# Patient Record
Sex: Male | Born: 1945
Health system: Southern US, Community
[De-identification: ages and names within clinical notes are randomized; demographics above are authoritative.]

## PROBLEM LIST (undated history)

## (undated) DIAGNOSIS — C801 Malignant (primary) neoplasm, unspecified: Secondary | ICD-10-CM

## (undated) DIAGNOSIS — K4031 Unilateral inguinal hernia, with obstruction, without gangrene, recurrent: Secondary | ICD-10-CM

## (undated) DIAGNOSIS — K420 Umbilical hernia with obstruction, without gangrene: Secondary | ICD-10-CM

## (undated) DIAGNOSIS — M1990 Unspecified osteoarthritis, unspecified site: Secondary | ICD-10-CM

## (undated) HISTORY — PX: PROSTATE BIOPSY: SHX241

## (undated) HISTORY — DX: Unspecified osteoarthritis, unspecified site: M19.90

## (undated) HISTORY — DX: Unilateral inguinal hernia, with obstruction, without gangrene, recurrent: K40.31

## (undated) HISTORY — PX: OTHER SURGICAL HISTORY: SHX169

## (undated) HISTORY — DX: Malignant (primary) neoplasm, unspecified: C80.1

## (undated) HISTORY — DX: Umbilical hernia with obstruction, without gangrene: K42.0

---

## 2000-03-08 HISTORY — PX: HERNIA REPAIR: SHX51

## 2004-03-08 HISTORY — PX: HERNIA REPAIR: SHX51

## 2007-03-20 ENCOUNTER — Ambulatory Visit: Payer: Self-pay | Admitting: Gastroenterology

## 2007-12-23 LAB — HM COLONOSCOPY: HM Colonoscopy: NORMAL

## 2011-03-09 DIAGNOSIS — M1990 Unspecified osteoarthritis, unspecified site: Secondary | ICD-10-CM

## 2011-03-09 HISTORY — DX: Unspecified osteoarthritis, unspecified site: M19.90

## 2012-02-01 ENCOUNTER — Encounter: Payer: Self-pay | Admitting: Internal Medicine

## 2012-02-25 ENCOUNTER — Encounter: Payer: Self-pay | Admitting: Internal Medicine

## 2012-02-25 ENCOUNTER — Ambulatory Visit (INDEPENDENT_AMBULATORY_CARE_PROVIDER_SITE_OTHER): Payer: BC Managed Care – PPO | Admitting: Internal Medicine

## 2012-02-25 VITALS — BP 100/60 | HR 51 | Temp 98.2°F | Resp 12 | Ht 67.0 in | Wt 155.8 lb

## 2012-02-25 DIAGNOSIS — E785 Hyperlipidemia, unspecified: Secondary | ICD-10-CM

## 2012-02-25 DIAGNOSIS — Z0000 Encounter for general adult medical examination without abnormal findings: Secondary | ICD-10-CM

## 2012-02-25 DIAGNOSIS — M1710 Unilateral primary osteoarthritis, unspecified knee: Secondary | ICD-10-CM

## 2012-02-25 DIAGNOSIS — M25569 Pain in unspecified knee: Secondary | ICD-10-CM

## 2012-02-25 DIAGNOSIS — R5383 Other fatigue: Secondary | ICD-10-CM

## 2012-02-25 DIAGNOSIS — K4030 Unilateral inguinal hernia, with obstruction, without gangrene, not specified as recurrent: Secondary | ICD-10-CM

## 2012-02-25 DIAGNOSIS — E559 Vitamin D deficiency, unspecified: Secondary | ICD-10-CM

## 2012-02-25 DIAGNOSIS — Z23 Encounter for immunization: Secondary | ICD-10-CM

## 2012-02-25 LAB — COMPREHENSIVE METABOLIC PANEL
ALT: 21 U/L (ref 0–53)
Albumin: 3.7 g/dL (ref 3.5–5.2)
CO2: 30 mEq/L (ref 19–32)
Calcium: 8.8 mg/dL (ref 8.4–10.5)
Chloride: 103 mEq/L (ref 96–112)
GFR: 86.38 mL/min (ref >60.00)
Glucose, Bld: 82 mg/dL (ref 70–99)
Sodium: 138 mEq/L (ref 135–145)
Total Bilirubin: 0.9 mg/dL (ref 0.3–1.2)
Total Protein: 7 g/dL (ref 6.0–8.3)

## 2012-02-25 LAB — CBC WITH DIFFERENTIAL/PLATELET
Basophils Absolute: 0 10*3/uL (ref 0.0–0.1)
Eosinophils Relative: 2.2 % (ref 0.0–5.0)
HCT: 43.5 % (ref 39.0–52.0)
Hemoglobin: 14.6 g/dL (ref 13.0–17.0)
Lymphocytes Relative: 28.9 % (ref 12.0–46.0)
Lymphs Abs: 1.7 10*3/uL (ref 0.7–4.0)
Monocytes Relative: 8.7 % (ref 3.0–12.0)
Platelets: 195 10*3/uL (ref 150.0–400.0)
RDW: 13.5 % (ref 11.5–14.6)
WBC: 5.9 10*3/uL (ref 4.5–10.5)

## 2012-02-25 LAB — LIPID PANEL
LDL Cholesterol: 113 mg/dL — ABNORMAL HIGH (ref 0–99)
Total CHOL/HDL Ratio: 5

## 2012-02-25 LAB — TSH: TSH: 0.42 u[IU]/mL (ref 0.35–5.50)

## 2012-02-25 NOTE — Patient Instructions (Addendum)
You received the pneumonia vaccine today  You do need a TDaP vaccine (tetanus diptheria and petussis); ask your daughter if she can get it for you and save you $$$$  Try glucosamine chondroitin sulfate supplements for your knees (osteo Biflex)   We will obtain plain films of your knees to evaluate the joint spaces .  Please get them done at the [facility] [place] office Monday through Friday fro m8 to 5  .  You do not need an appt.   I will e mail your your results if you sign up for MyChart

## 2012-02-25 NOTE — Progress Notes (Signed)
Patient ID: Ethan Singleton, male   DOB: [birth year], 66 y.o.   MRN: [id number] Patient Active Problem List  Diagnosis  . Inguinal hernia with irreducibility  . DJD (degenerative joint disease) of knee  . Routine general medical examination at a health care facility    Subjective:  CC:   Chief Complaint  Patient presents with  . Annual Exam    HPI:   Ethan Singleton is a 66 y.o. male who presents as a new patient to establish primary care with the chief complaint of  Need for annual exam he is requesting  a physical>  Hr has a   history of prosate CA diagnosed 2 years ago .  Treatment has been watchful waiting and finasteride for BPH.  His last biopsy in June was normal .  Feel that his right inguinal hernia has returned after a heavy lifting incident of 50 lbs at work.  He has a history of hernia repair with mesh on the right.  2) Bilateral knees are bothering him,  Ran  Long distance for years.  Aching pain.,  Very stiff in the morning.,  history of partial meniscotomy left knee 2003 after tennis .      Past Medical History  Diagnosis Date  . prostate cancer     Past Surgical History  Procedure Date  . Hernia repair 2006    done in [place], right   . Meniscotomy     partial, right knee    Family History  Problem Relation Age of Onset  . Hypertension Mother   . Heart disease Father   . Cancer Sister     lung Ca,  passive smoke exposure     History   Social History  . Marital Status: Unknown    Spouse Name: N/A    Number of Children: N/A  . Years of Education: N/A   Occupational History  . Not on file.   Social History Main Topics  . Smoking status: Former Games developer  . Smokeless tobacco: Not on file  . Alcohol Use: No  . Drug Use: No  . Sexually Active: Not on file   Other Topics Concern  . Not on file   Social History Narrative  . No narrative on file    No Known Allergies   Review of Systems:   The remainder of the review of systems was negative except  those addressed in the HPI.    Objective:  BP 100/60  Pulse 51  Temp 98.2 F (36.8 C) (Oral)  Resp 12  Ht 5\' 7"  (1.702 m)  Wt 155 lb 12 oz (70.648 kg)  BMI 24.39 kg/m2  SpO2 95%  General appearance: alert, cooperative and appears stated age Ears: normal TM's and external ear canals both ears Throat: lips, mucosa, and tongue normal; teeth and gums normal Neck: no adenopathy, no carotid bruit, supple, symmetrical, trachea midline and thyroid not enlarged, symmetric, no tenderness/mass/nodules Back: symmetric, no curvature. ROM normal. No CVA tenderness. Lungs: clear to auscultation bilaterally Heart: regular rate and rhythm, S1, S2 normal, no murmur, click, rub or gallop Abdomen: soft, non-tender; bowel sounds normal; no masses,  no organomegaly Pulses: 2+ and symmetric Skin: Skin color, texture, turgor normal. No rashes or lesions Lymph nodes: Cervical, supraclavicular, and axillary nodes normal. URO:  Right inguinal swelling/hernia, nonreducible   Assessment and Plan:  Inguinal hernia with irreducibility Recurrent, on the right, with prior surgical repair with mesh.  Referral to [facility].   DJD (degenerative joint  disease) of knee Recommended trial of NSAIDs and tylenol.   Routine general medical examination at a health care facility Annual exam done excluding  prostate exam since she is followed by Urology.    Updated Medication List Outpatient Encounter Prescriptions as of 02/25/2012  Medication Sig Dispense Refill  . pseudoephedrine (SUDAFED) 30 MG tablet Take 30 mg by mouth every 4 (four) hours as needed.         Orders Placed This Encounter  Procedures  . DG Knee Complete 4 Views Left  . DG Knee Complete 4 Views Right  . Pneumococcal polysaccharide vaccine 23-valent greater than or equal to 2yo subcutaneous/IM  . Lipid panel  . CBC with Differential  . Comprehensive metabolic panel  . TSH  . Vitamin D 25 hydroxy  . Ambulatory referral to General  Surgery  . HM COLONOSCOPY    No Follow-up on file.

## 2012-02-26 LAB — VITAMIN D 25 HYDROXY (VIT D DEFICIENCY, FRACTURES): Vit D, 25-Hydroxy: 29 ng/mL — ABNORMAL LOW (ref 30–89)

## 2012-02-28 ENCOUNTER — Encounter: Payer: Self-pay | Admitting: Internal Medicine

## 2012-02-28 DIAGNOSIS — M1710 Unilateral primary osteoarthritis, unspecified knee: Secondary | ICD-10-CM | POA: Insufficient documentation

## 2012-02-28 DIAGNOSIS — Z0000 Encounter for general adult medical examination without abnormal findings: Secondary | ICD-10-CM | POA: Insufficient documentation

## 2012-02-28 DIAGNOSIS — K4030 Unilateral inguinal hernia, with obstruction, without gangrene, not specified as recurrent: Secondary | ICD-10-CM | POA: Insufficient documentation

## 2012-02-28 NOTE — Assessment & Plan Note (Signed)
Recurrent, on the right, with prior surgical repair with mesh.  Referral to [facility].

## 2012-02-28 NOTE — Assessment & Plan Note (Signed)
Annual exam done excluding  prostate exam since she is followed by Urology.

## 2012-02-28 NOTE — Assessment & Plan Note (Signed)
Recommended trial of NSAIDs and tylenol.

## 2012-03-02 ENCOUNTER — Encounter: Payer: Self-pay | Admitting: Internal Medicine

## 2012-03-02 NOTE — Telephone Encounter (Signed)
Ethan Singleton, I am not sure why you can't review your labs when you click on the icon. I have copied and pasted them to this email.  Along with the message Dr. Darrick Huntsman placed on the labs.  Thank you [name]    Cholesterol 168   Comments:    ATP III Classification       Desirable:  < 200 mg/dL               Borderline High:  200 - 239 mg/dL          High:  > = 478 mg/dL    Triglycerides 295.6    Comments:    Normal:  <150 mg/dLBorderline High:  150 - 199 mg/dL    HDL 21.30 (L)     VLDL 21.8   LDL Cholesterol 113 (H)  0 - 99 mg/dL    Total CHOL/HDL Ratio 5     Comments:                   Men          Women1/2 Average Risk     3.4          3.3Average Risk          5.0          4.42X Average Risk          9.6          7.13X Average Risk          15.0          11.0        Value Range    WBC 5.9    RBC 4.81      Hemoglobin 14.6     HCT 43.5      MCV 90.5     MCHC 33.4     RDW 13.5      Platelets 195.0    Neutrophils Relative 59.5      Lymphocytes Relative 28.9     Monocytes Relative 8.7     Eosinophils Relative 2.2     Basophils Relative 0.7    Neutro Abs 3.5      Lymphs Abs 1.7    Monocytes Absolute 0.5     Eosinophils Absolute 0.1     Basophils Absolute 0.0      Sodium 138     Potassium 3.6    Chloride 103   CO2 30   Glucose, Bld 82     BUN 12    Creatinine, Ser 0.9    Total Bilirubin 0.9    Alkaline Phosphatase 50   AST 23   ALT 21   Total Protein 7.0    Albumin 3.7    Calcium 8.8     GFR 86.38     TSH 0.42 0.35     Vit D, 25-Hydroxy 29 (L)  Comments:          Ethan Singleton,   Your labs are all normal except for a low Vitamin D .  I suggest you start taking 2000 units of Vitamin D 3 daily  For the winter months.  Once spring is here you can reduce your dose to 1000 units daily .   Dr. Darrick Huntsman

## 2012-03-03 ENCOUNTER — Ambulatory Visit (INDEPENDENT_AMBULATORY_CARE_PROVIDER_SITE_OTHER)
Admission: RE | Admit: 2012-03-03 | Discharge: 2012-03-03 | Disposition: A | Discharge status: Home / Self Care | Payer: BC Managed Care – PPO | Source: Ambulatory Visit | Attending: Internal Medicine | Admitting: Internal Medicine

## 2012-03-03 DIAGNOSIS — M25569 Pain in unspecified knee: Secondary | ICD-10-CM

## 2012-03-05 ENCOUNTER — Telehealth: Payer: Self-pay | Admitting: Internal Medicine

## 2012-03-06 ENCOUNTER — Ambulatory Visit: Payer: Self-pay | Admitting: Internal Medicine

## 2012-03-08 DIAGNOSIS — K420 Umbilical hernia with obstruction, without gangrene: Secondary | ICD-10-CM

## 2012-03-08 DIAGNOSIS — K4031 Unilateral inguinal hernia, with obstruction, without gangrene, recurrent: Secondary | ICD-10-CM

## 2012-03-08 HISTORY — DX: Unilateral inguinal hernia, with obstruction, without gangrene, recurrent: K40.31

## 2012-03-08 HISTORY — DX: Umbilical hernia with obstruction, without gangrene: K42.0

## 2012-03-12 ENCOUNTER — Encounter: Payer: Self-pay | Admitting: Internal Medicine

## 2012-03-18 ENCOUNTER — Encounter: Payer: Self-pay | Admitting: Internal Medicine

## 2012-03-20 NOTE — Telephone Encounter (Signed)
I do not know what letter pt is referencing. Please advise.

## 2012-04-22 ENCOUNTER — Other Ambulatory Visit: Payer: Self-pay

## 2012-05-08 ENCOUNTER — Ambulatory Visit: Payer: Self-pay | Admitting: General Surgery

## 2012-05-19 ENCOUNTER — Ambulatory Visit: Payer: Self-pay | Admitting: General Surgery

## 2012-05-19 DIAGNOSIS — K4091 Unilateral inguinal hernia, without obstruction or gangrene, recurrent: Secondary | ICD-10-CM

## 2012-05-19 DIAGNOSIS — K420 Umbilical hernia with obstruction, without gangrene: Secondary | ICD-10-CM

## 2012-05-19 HISTORY — PX: UMBILICAL HERNIA REPAIR: SHX196

## 2012-05-31 ENCOUNTER — Encounter: Payer: Self-pay | Admitting: *Deleted

## 2012-06-05 ENCOUNTER — Ambulatory Visit (INDEPENDENT_AMBULATORY_CARE_PROVIDER_SITE_OTHER): Payer: BC Managed Care – PPO | Admitting: General Surgery

## 2012-06-05 ENCOUNTER — Encounter: Payer: Self-pay | Admitting: General Surgery

## 2012-06-05 VITALS — BP 124/76 | HR 76 | Resp 12 | Ht 68.0 in | Wt 160.0 lb

## 2012-06-05 DIAGNOSIS — K429 Umbilical hernia without obstruction or gangrene: Secondary | ICD-10-CM

## 2012-06-05 DIAGNOSIS — K4091 Unilateral inguinal hernia, without obstruction or gangrene, recurrent: Secondary | ICD-10-CM

## 2012-06-05 NOTE — Progress Notes (Signed)
Patient ID: Ethan Singleton, male   DOB: [birth year], 67 y.o.   MRN: [id number]  Chief Complaint  Patient presents with  . Routine Post Op    hernia post op appt    HPI Ethan Singleton is a 67 y.o. male that presents for post op umbilical and recurrent right inguinal hernia repair. No mesh used at umbilicus. Progrip mesh in inguinal areaThe procedure was performed on 05/19/12.  HPI  Past Medical History  Diagnosis Date  . prostate cancer   . Arthritis 2013  . Inguinal hernia with obstruction, without mention of gangrene, recurrent unilateral or unspecified 2014  . Umbilical hernia with obstruction 2014    Past Surgical History  Procedure Laterality Date  . Meniscotomy      partial, right knee  . Hernia repair  2006    done in [place], right   . Hernia repair  2002    [facility], [place], with mesh, laparoscopic  . Prostate biopsy      procedure done for elevated psa. 1 sample positive for cancer out of 16 taken. by Dr. Lonna Cobb  . Umbilical hernia repair  05/19/12    [facility] repair and umbilical hernia repair by Dr. Janeth Rase) with mesh if indicated    Family History  Problem Relation Age of Onset  . Hypertension Mother   . Heart disease Father   . Cancer Sister     lung Ca,  passive smoke exposure     Social History History  Substance Use Topics  . Smoking status: Never Smoker   . Smokeless tobacco: Not on file  . Alcohol Use: 1.2 - 1.8 oz/week    2-3 Glasses of wine per week     Comment: drinks wine a couple of times/week with dinner                                                                         No Known Allergies  Current Outpatient Prescriptions  Medication Sig Dispense Refill  . aspirin 81 MG tablet Take 81 mg by mouth daily.      . finasteride (PROSCAR) 5 MG tablet Take 5 mg by mouth daily.      . pseudoephedrine (SUDAFED) 30 MG tablet Take 30 mg by mouth every 4 (four) hours as needed.       No current facility-administered medications for this visit.     Review of Systems Review of Systems  There were no vitals taken for this visit.  Physical Exam Physical Exam  Constitutional: He appears well-developed and well-nourished.  Abdominal: Soft. Bowel sounds are normal.      Data Reviewed    Assessment    Doing well postop.     Plan    Recheck in 4 weeks        Darnelle Catalan 06/05/2012, 1:45 PM

## 2012-06-05 NOTE — Patient Instructions (Addendum)
Patient to return to the office in four weeks. Advance activity as tolerated

## 2012-06-06 ENCOUNTER — Encounter: Payer: Self-pay | Admitting: General Surgery

## 2012-06-06 DIAGNOSIS — K4091 Unilateral inguinal hernia, without obstruction or gangrene, recurrent: Secondary | ICD-10-CM | POA: Insufficient documentation

## 2012-06-06 DIAGNOSIS — Z8719 Personal history of other diseases of the digestive system: Secondary | ICD-10-CM | POA: Insufficient documentation

## 2012-07-03 ENCOUNTER — Ambulatory Visit (INDEPENDENT_AMBULATORY_CARE_PROVIDER_SITE_OTHER): Payer: Medicare Other | Admitting: General Surgery

## 2012-07-03 ENCOUNTER — Encounter: Payer: Self-pay | Admitting: General Surgery

## 2012-07-03 VITALS — BP 130/72 | HR 76 | Resp 14 | Ht 67.0 in | Wt 160.0 lb

## 2012-07-03 DIAGNOSIS — K4091 Unilateral inguinal hernia, without obstruction or gangrene, recurrent: Secondary | ICD-10-CM

## 2012-07-03 NOTE — Progress Notes (Signed)
This is a post op visit from a hernia repair.  Groin incision looks clean, well healed, repair is intact

## 2012-07-03 NOTE — Patient Instructions (Addendum)
Return as needed

## 2012-11-18 DIAGNOSIS — Z23 Encounter for immunization: Secondary | ICD-10-CM | POA: Diagnosis not present

## 2012-12-22 LAB — HM COLONOSCOPY
HM COLON: NORMAL
HM Colonoscopy: NORMAL

## 2013-01-11 ENCOUNTER — Other Ambulatory Visit: Payer: Self-pay

## 2013-06-04 DIAGNOSIS — C61 Malignant neoplasm of prostate: Secondary | ICD-10-CM | POA: Diagnosis not present

## 2013-06-25 DIAGNOSIS — C61 Malignant neoplasm of prostate: Secondary | ICD-10-CM | POA: Diagnosis not present

## 2013-07-16 DIAGNOSIS — C61 Malignant neoplasm of prostate: Secondary | ICD-10-CM | POA: Diagnosis not present

## 2013-07-16 DIAGNOSIS — N429 Disorder of prostate, unspecified: Secondary | ICD-10-CM | POA: Diagnosis not present

## 2013-10-18 ENCOUNTER — Encounter: Payer: Self-pay | Admitting: Internal Medicine

## 2013-10-18 ENCOUNTER — Ambulatory Visit (INDEPENDENT_AMBULATORY_CARE_PROVIDER_SITE_OTHER): Payer: Medicare Other | Admitting: Internal Medicine

## 2013-10-18 VITALS — BP 118/60 | HR 59 | Temp 98.6°F | Resp 16 | Ht 67.0 in | Wt 160.8 lb

## 2013-10-18 DIAGNOSIS — R5383 Other fatigue: Secondary | ICD-10-CM | POA: Diagnosis not present

## 2013-10-18 DIAGNOSIS — R5381 Other malaise: Secondary | ICD-10-CM | POA: Diagnosis not present

## 2013-10-18 DIAGNOSIS — Z1159 Encounter for screening for other viral diseases: Secondary | ICD-10-CM

## 2013-10-18 DIAGNOSIS — Z125 Encounter for screening for malignant neoplasm of prostate: Secondary | ICD-10-CM

## 2013-10-18 DIAGNOSIS — Z0000 Encounter for general adult medical examination without abnormal findings: Secondary | ICD-10-CM

## 2013-10-18 DIAGNOSIS — M1712 Unilateral primary osteoarthritis, left knee: Secondary | ICD-10-CM

## 2013-10-18 DIAGNOSIS — E785 Hyperlipidemia, unspecified: Secondary | ICD-10-CM

## 2013-10-18 DIAGNOSIS — C61 Malignant neoplasm of prostate: Secondary | ICD-10-CM | POA: Insufficient documentation

## 2013-10-18 DIAGNOSIS — M2041 Other hammer toe(s) (acquired), right foot: Secondary | ICD-10-CM

## 2013-10-18 LAB — COMPREHENSIVE METABOLIC PANEL
ALBUMIN: 3.9 g/dL (ref 3.5–5.2)
ALK PHOS: 49 U/L (ref 39–117)
ALT: 25 U/L (ref 0–53)
AST: 33 U/L (ref 0–37)
BUN: 18 mg/dL (ref 6–23)
CO2: 25 mEq/L (ref 19–32)
CREATININE: 0.9 mg/dL (ref 0.4–1.5)
Calcium: 9 mg/dL (ref 8.4–10.5)
Chloride: 104 mEq/L (ref 96–112)
GFR: 89.26 mL/min (ref >60.00)
GLUCOSE: 86 mg/dL (ref 70–99)
POTASSIUM: 4.4 meq/L (ref 3.5–5.1)
Sodium: 136 mEq/L (ref 135–145)
Total Bilirubin: 1.4 mg/dL — ABNORMAL HIGH (ref 0.2–1.2)
Total Protein: 6.6 g/dL (ref 6.0–8.3)

## 2013-10-18 MED ORDER — TETANUS-DIPHTH-ACELL PERTUSSIS 5-2.5-18.5 LF-MCG/0.5 IM SUSP: 0.5000 mL | Freq: Once | INTRAMUSCULAR | Status: DC

## 2013-10-18 MED ORDER — ZOSTER VACCINE LIVE 19400 UNT/0.65ML ~~LOC~~ SOLR: 0.6500 mL | Freq: Once | SUBCUTANEOUS | Status: DC

## 2013-10-18 NOTE — Progress Notes (Signed)
Pre visit review using our clinic review tool, if applicable. No additional management support is needed unless otherwise documented below in the visit note. 

## 2013-10-18 NOTE — Patient Instructions (Addendum)
You had your annual Medicare wellness exam today   You need to have a TDaP vaccine and a Pneumonia  vaccine.  I have given you prescription for the TDaP  Because it  will be cheaper at the health Dept or at your local pharmacy and Medicare will not reimburse for them.   You declined gthe new  pneumonia vaccine today.  We will contact you with the bloodwork results  Referral to Dr Vickki Muff for your toe problem  is in process.  Health Maintenance A healthy lifestyle and preventative care can promote health and wellness.  Maintain regular health, dental, and eye exams.  Eat a healthy diet. Foods like vegetables, fruits, whole grains, low-fat dairy products, and lean protein foods contain the nutrients you need and are low in calories. Decrease your intake of foods high in solid fats, added sugars, and salt. Get information about a proper diet from your health care provider, if necessary.  Regular physical exercise is one of the most important things you can do for your health. Most adults should get at least 150 minutes of moderate-intensity exercise (any activity that increases your heart rate and causes you to sweat) each week. In addition, most adults need muscle-strengthening exercises on 2 or more days a week.   Maintain a healthy weight. The body mass index (BMI) is a screening tool to identify possible weight problems. It provides an estimate of body fat based on height and weight. Your health care provider can find your BMI and can help you achieve or maintain a healthy weight. For males 20 years and older:  A BMI below 18.5 is considered underweight.  A BMI of 18.5 to 24.9 is normal.  A BMI of 25 to 29.9 is considered overweight.  A BMI of 30 and above is considered obese.  Maintain normal blood lipids and cholesterol by exercising and minimizing your intake of saturated fat. Eat a balanced diet with plenty of fruits and vegetables. Blood tests for lipids and cholesterol should  begin at age 33 and be repeated every 5 years. If your lipid or cholesterol levels are high, you are over age 84, or you are at high risk for heart disease, you may need your cholesterol levels checked more frequently.Ongoing high lipid and cholesterol levels should be treated with medicines if diet and exercise are not working.  If you smoke, find out from your health care provider how to quit. If you do not use tobacco, do not start.  Lung cancer screening is recommended for adults aged 62-80 years who are at high risk for developing lung cancer because of a history of smoking. A yearly low-dose CT scan of the lungs is recommended for people who have at least a 30-pack-year history of smoking and are current smokers or have quit within the past 15 years. A pack year of smoking is smoking an average of 1 pack of cigarettes a day for 1 year (for example, a 30-pack-year history of smoking could mean smoking 1 pack a day for 30 years or 2 packs a day for 15 years). Yearly screening should continue until the smoker has stopped smoking for at least 15 years. Yearly screening should be stopped for people who develop a health problem that would prevent them from having lung cancer treatment.  If you choose to drink alcohol, do not have more than 2 drinks per day. One drink is considered to be 12 oz (360 mL) of beer, 5 oz (150 mL) of wine, or  1.5 oz (45 mL) of liquor.  Avoid the use of street drugs. Do not share needles with anyone. Ask for help if you need support or instructions about stopping the use of drugs.  High blood pressure causes heart disease and increases the risk of stroke. Blood pressure should be checked at least every 1-2 years. Ongoing high blood pressure should be treated with medicines if weight loss and exercise are not effective.  If you are 4-21 years old, ask your health care provider if you should take aspirin to prevent heart disease.  Diabetes screening involves taking a blood  sample to check your fasting blood sugar level. This should be done once every 3 years after age 52 if you are at a normal weight and without risk factors for diabetes. Testing should be considered at a younger age or be carried out more frequently if you are overweight and have at least 1 risk factor for diabetes.  Colorectal cancer can be detected and often prevented. Most routine colorectal cancer screening begins at the age of 20 and continues through age 55. However, your health care provider may recommend screening at an earlier age if you have risk factors for colon cancer. On a yearly basis, your health care provider may provide home test kits to check for hidden blood in the stool. A small camera at the end of a tube may be used to directly examine the colon (sigmoidoscopy or colonoscopy) to detect the earliest forms of colorectal cancer. Talk to your health care provider about this at age 82 when routine screening begins. A direct exam of the colon should be repeated every 5-10 years through age 19, unless early forms of precancerous polyps or small growths are found.  People who are at an increased risk for hepatitis B should be screened for this virus. You are considered at high risk for hepatitis B if:  You were born in a country where hepatitis B occurs often. Talk with your health care provider about which countries are considered high risk.  Your parents were born in a high-risk country and you have not received a shot to protect against hepatitis B (hepatitis B vaccine).  You have HIV or AIDS.  You use needles to inject street drugs.  You live with, or have sex with, someone who has hepatitis B.  You are a man who has sex with other men (MSM).  You get hemodialysis treatment.  You take certain medicines for conditions like cancer, organ transplantation, and autoimmune conditions.  Hepatitis C blood testing is recommended for all people born from [birth year] through 1965 and any  individual with known risk factors for hepatitis C.  Healthy men should no longer receive prostate-specific antigen (PSA) blood tests as part of routine cancer screening. Talk to your health care provider about prostate cancer screening.  Testicular cancer screening is not recommended for adolescents or adult males who have no symptoms. Screening includes self-exam, a health care provider exam, and other screening tests. Consult with your health care provider about any symptoms you have or any concerns you have about testicular cancer.  Practice safe sex. Use condoms and avoid high-risk sexual practices to reduce the spread of sexually transmitted infections (STIs).  You should be screened for STIs, including gonorrhea and chlamydia if:  You are sexually active and are younger than 24 years.  You are older than 24 years, and your health care provider tells you that you are at risk for this type of infection.  Your sexual activity has changed since you were last screened, and you are at an increased risk for chlamydia or gonorrhea. Ask your health care provider if you are at risk.  If you are at risk of being infected with HIV, it is recommended that you take a prescription medicine daily to prevent HIV infection. This is called pre-exposure prophylaxis (PrEP). You are considered at risk if:  You are a man who has sex with other men (MSM).  You are a heterosexual man who is sexually active with multiple partners.  You take drugs by injection.  You are sexually active with a partner who has HIV.  Talk with your health care provider about whether you are at high risk of being infected with HIV. If you choose to begin PrEP, you should first be tested for HIV. You should then be tested every 3 months for as long as you are taking PrEP.  Use sunscreen. Apply sunscreen liberally and repeatedly throughout the day. You should seek shade when your shadow is shorter than you. Protect yourself by  wearing long sleeves, pants, a wide-brimmed hat, and sunglasses year round whenever you are outdoors.  Tell your health care provider of new moles or changes in moles, especially if there is a change in shape or color. Also, tell your health care provider if a mole is larger than the size of a pencil eraser.  A one-time screening for abdominal aortic aneurysm (AAA) and surgical repair of large AAAs by ultrasound is recommended for men aged 29-75 years who are current or former smokers.  Stay current with your vaccines (immunizations). Document Released: 08/21/2007 Document Revised: 02/27/2013 Document Reviewed: 07/20/2010 [facility] Patient Information 2015 [place], [place]. This information is not intended to replace advice given to you by your health care provider. Make sure you discuss any questions you have with your health care provider.

## 2013-10-18 NOTE — Assessment & Plan Note (Signed)
Prior biopsy 16 samples one was positive.,  Has been on finasteride for 6 years..  2nd biopsy 6 months later was negative. Cope  Annual PSAs are done by him.  3rd one was done in March

## 2013-10-18 NOTE — Progress Notes (Signed)
Patient ID: Ethan Singleton, male   DOB: 1946/01/06, 68 y.o.   MRN: [id number] The patient is here for annual Medicare wellness examination and management of other chronic and acute problems.   The risk factors are reflected in the social history.  The roster of all physicians providing medical care to patient - is listed in the Snapshot section of the chart.  Activities of daily living:  The patient is 100% independent in all ADLs: dressing, toileting, feeding as well as independent mobility  Home safety : The patient has smoke detectors in the home. They wear seatbelts.  There are no firearms at home. There is no violence in the home.   There is no risks for hepatitis, STDs or HIV. There is no   history of blood transfusion. They have no travel history to infectious disease endemic areas of the world.  The patient has seen their dentist in the last six month. They have seen their eye doctor in the last year and has a cataract in his right eye which is planned for laser removal in the next year or so. . They admit to slight hearing difficulty with regard to whispered voices and some television programs.  They have deferred audiologic testing in the last year.  They do not  have excessive sun exposure. Discussed the need for sun protection: hats, long sleeves and use of sunscreen if there is significant sun exposure.   Diet: the importance of a healthy diet is discussed. They do have a healthy diet.  The benefits of regular aerobic exercise were discussed. 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  He was counselled on need for aerobic exercise 30 mintues 5 days per week.  Depression screen: there are no signs or vegative symptoms of depression- irritability, change in appetite, anhedonia, sadness/tearfullness.  Cognitive assessment: the patient manages all their financial and personal affairs and is actively engaged. They could relate day,date,year and events; recalled 2/3 objects at 3  minutes; performed clock-face test normally.  The following portions of the patient's history were reviewed and updated as appropriate: allergies, current medications, past family history, past medical history,  past surgical history, past social history  and problem list.  Visual acuity was not assessed per patient preference since she has regular follow up with her ophthalmologist. Hearing and body mass index were assessed and reviewed.   During the course of the visit the patient was educated and counseled about appropriate screening and preventive services including : fall prevention , diabetes screening, nutrition counseling, colorectal cancer screening, and recommended immunizations.    O bjective  BP 118/60  Pulse 59  Temp(Src) 98.6 F (37 C) (Oral)  Resp 16  Ht 5\' 7"  (1.702 m)  Wt 160 lb 12.8 oz (72.938 kg)  BMI 25.18 kg/m2  SpO2 97%  General Appearance:    Alert, cooperative, no distress, appears stated age  Head:    Normocephalic, without obvious abnormality, atraumatic  Eyes:    PERRL, conjunctiva/corneas clear, EOM's intact, fundi    benign, both eyes       Ears:    Normal TM's and external ear canals, both ears  Nose:   Nares normal, septum midline, mucosa normal, no drainage   or sinus tenderness  Throat:   Lips, mucosa, and tongue normal; teeth and gums normal  Neck:   Supple, symmetrical, trachea midline, no adenopathy;       thyroid:  No enlargement/tenderness/nodules; no carotid   bruit  or JVD  Back:     Symmetric, no curvature, ROM normal, no CVA tenderness  Lungs:     Clear to auscultation bilaterally, respirations unlabored  Chest wall:    No tenderness or deformity  Heart:    Regular rate and rhythm, S1 and S2 normal, no murmur, rub   or gallop  Abdomen:     Soft, non-tender, bowel sounds active all four quadrants,    no masses, no organomegaly  Genitalia:    Deferred by patient (done by urology)   Rectal:    Deferred by patient   Extremities:    Extremities normal, atraumatic, no cyanosis or edema  Pulses:   2+ and symmetric all extremities  Skin:   Skin color, texture, turgor normal, no rashes or lesions  Lymph nodes:   Cervical, supraclavicular, and axillary nodes normal  Neurologic:   CNII-XII intact. Normal strength, sensation and reflexes      throughout   Assessment and Plan:  Screening for prostate cancer Prior biopsy 16 samples one was positive.,  Has been on finasteride for 6 years..  2nd biopsy 6 months later was negative. [place]ope  Annual PSAs are done by him.  3rd one was done in March    Routine general medical examination at a health care facility Annual Medicare wellness  exam was done as well as a comprehensive physical exam and management of acute and chronic conditions .  During the course of the visit the patient was educated and counseled about appropriate screening and preventive services including : fall prevention , diabetes screening, nutrition counseling, colorectal cancer screening, and recommended immunizations.  Printed recommendations for health maintenance screenings was given.   Left knee DJD Using turmeric daily for the past 4 months,  motrin once a week prn .    Updated Medication List Outpatient Encounter Prescriptions as of 10/18/2013  Medication Sig  . aspirin 81 MG tablet Take 81 mg by mouth daily.  . finasteride (PROSCAR) 5 MG tablet Take 5 mg by mouth daily.  . pseudoephedrine (SUDAFED) 30 MG tablet Take 30 mg by mouth every 4 (four) hours as needed.  . Tdap (BOOSTRIX) 5-2.5-18.5 LF-MCG/0.5 injection Inject 0.5 mLs into the muscle once.  . zoster vaccine live, PF, (ZOSTAVAX) 56314 UNT/0.65ML injection Inject 19,400 Units into the skin once.

## 2013-10-18 NOTE — Assessment & Plan Note (Signed)

## 2013-10-18 NOTE — Assessment & Plan Note (Signed)
Using turmeric daily for the past 4 months,  motrin once a week prn .

## 2013-10-19 LAB — HEPATITIS C ANTIBODY: HCV AB: NEGATIVE

## 2013-10-22 ENCOUNTER — Telehealth: Payer: Self-pay | Admitting: *Deleted

## 2013-10-22 MED ORDER — PNEUMOCOCCAL 13-VAL CONJ VACC IM SUSP: 0.5000 mL | Freq: Once | INTRAMUSCULAR | Status: DC

## 2013-10-22 NOTE — Telephone Encounter (Signed)
On printer

## 2013-10-22 NOTE — Telephone Encounter (Signed)
Pt called, requesting Rx for Prevnar. Has a pharmacist daughter out of town, would like to have it done there. [place]?  Mail to patient when ready per his request

## 2013-10-23 ENCOUNTER — Encounter: Payer: Self-pay | Admitting: Internal Medicine

## 2013-10-23 NOTE — Telephone Encounter (Signed)
Script mailed as requested to patient.

## 2013-10-23 NOTE — Progress Notes (Signed)
Faxed [employer] for wellness form and sent copy to scan.

## 2013-10-24 ENCOUNTER — Encounter: Payer: Self-pay | Admitting: Internal Medicine

## 2013-10-24 DIAGNOSIS — S86111A Strain of other muscle(s) and tendon(s) of posterior muscle group at lower leg level, right leg, initial encounter: Secondary | ICD-10-CM

## 2013-10-25 ENCOUNTER — Encounter: Payer: Self-pay | Admitting: Internal Medicine

## 2013-11-01 DIAGNOSIS — M79609 Pain in unspecified limb: Secondary | ICD-10-CM | POA: Diagnosis not present

## 2013-11-01 DIAGNOSIS — M2010 Hallux valgus (acquired), unspecified foot: Secondary | ICD-10-CM | POA: Diagnosis not present

## 2013-11-01 DIAGNOSIS — M2040 Other hammer toe(s) (acquired), unspecified foot: Secondary | ICD-10-CM | POA: Diagnosis not present

## 2013-11-04 NOTE — Telephone Encounter (Signed)
Patient had an event on Friday and needs orth referral for suspected gastrocnemisu muscle /rupture based on history .  First available Ortho

## 2013-11-05 ENCOUNTER — Telehealth: Payer: Self-pay | Admitting: Internal Medicine

## 2013-11-05 DIAGNOSIS — S86112A Strain of other muscle(s) and tendon(s) of posterior muscle group at lower leg level, left leg, initial encounter: Secondary | ICD-10-CM

## 2013-11-05 DIAGNOSIS — S86111A Strain of other muscle(s) and tendon(s) of posterior muscle group at lower leg level, right leg, initial encounter: Secondary | ICD-10-CM | POA: Insufficient documentation

## 2013-11-05 NOTE — Assessment & Plan Note (Addendum)
Patient was contacted [facility] via e mail for Mychart concern.  gastroc rupture suspected given history of acute pain while pushing lawnmower  up hill, followed by a tender bulge at top of calf. Ortho referral,  NSAID,  Ice.

## 2013-11-07 ENCOUNTER — Encounter: Payer: Self-pay | Admitting: Family Medicine

## 2013-11-07 ENCOUNTER — Ambulatory Visit (INDEPENDENT_AMBULATORY_CARE_PROVIDER_SITE_OTHER): Payer: Medicare Other | Admitting: Family Medicine

## 2013-11-07 VITALS — BP 130/72 | HR 44 | Temp 97.6°F | Ht 67.0 in | Wt 159.2 lb

## 2013-11-07 DIAGNOSIS — S838X9A Sprain of other specified parts of unspecified knee, initial encounter: Secondary | ICD-10-CM | POA: Diagnosis not present

## 2013-11-07 DIAGNOSIS — S86819A Strain of other muscle(s) and tendon(s) at lower leg level, unspecified leg, initial encounter: Secondary | ICD-10-CM

## 2013-11-07 DIAGNOSIS — S86111A Strain of other muscle(s) and tendon(s) of posterior muscle group at lower leg level, right leg, initial encounter: Secondary | ICD-10-CM

## 2013-11-07 NOTE — Progress Notes (Signed)
Pre visit review using our clinic review tool, if applicable. No additional management support is needed unless otherwise documented below in the visit note. 

## 2013-11-07 NOTE — Progress Notes (Signed)
Dr. Frederico Hamman T. Kamaile Zachow, MD, [facility] [place], [address] Phone: [phone] Fax: [phone]  11/07/2013  Patient: Ethan Singleton, MRN: [id number], DOB: June 16, 1945, 68 y.o.  Primary Physician:  Deborra Medina, MD  Chief Complaint: Consultation  Subjective:   Ethan Singleton is a 68 y.o. very pleasant male patient who presents with the following:  Patient of Dr. Lupita Dawn who presents for evaluation of right-sided acute posterior lower extremity pain. This is a very pleasant gentleman who has rheumatoid arthritis.  Date of injury: November 02, 2013.  Gastroc rupture: Has some RA and typically has some knee pain at baseline. He does not take any DMARDS, and he intermittently will take ibuprofen. Then had some pain posteriorly acutely had some pain posteriorly behind his knee on the RIGHT side, and he felt and heard a pop. He had some initial swelling without any bruising, but that is improved since that initial date of injury. He has been taking some ibuprofen only and has done some icing.  Past Medical History, Surgical History, Social History, Family History, Problem List, Medications, and Allergies have been reviewed and updated if relevant.  GEN: No fevers, chills. Nontoxic. Primarily MSK c/o today. MSK: Detailed in the HPI GI: tolerating PO intake without difficulty Neuro: No numbness, parasthesias, or tingling associated. Otherwise the pertinent positives of the ROS are noted above.   Objective:   BP 130/72  Pulse 44  Temp(Src) 97.6 F (36.4 C) (Oral)  Ht 5\' 7"  (1.702 m)  Wt 159 lb 4 oz (72.235 kg)  BMI 24.94 kg/m2  SpO2 97%   GEN: WDWN, NAD, Non-toxic, Alert & Oriented x 3 HEENT: Atraumatic, Normocephalic.  Ears and Nose: No external deformity. EXTR: No clubbing/cyanosis/edema NEURO: Normal gait, mild antalgia PSYCH: Normally interactive. Conversant. Not depressed or anxious appearing.  Calm demeanor.     rIGHT knee: The patient does have some mild joint line tenderness medially and laterally on both knees with a minimal effusion. He does have full extension. Flexion to approximately 115. Stable to varus and valgus stress. Lachman is negative.  Homans on the LEFT is negative, and the entirety of the LEFT calf is negative and nontender. LEFT hamstring is 5/5and concentric and eccentric strength testing.  On the RIGHT knee there is a palpable deficit on the medial aspect of the proximal calf. Strength testing with concentric and eccentric strength testing on the hamstring is negative and nontender. Proximal calf is tender to palpation in the region of the deficit and just proximal to this there is some swelling.   Radiology: Diagnostic Ultrasound Evaluation Terason t3000, MSK ultrasound, MSK probe Anatomy scanned: RIGHT CALF Indication: Pain Findings: in the proximal, medial There is  It is anechoic and with well-demarcated borders that would be consistent with hematoma given his clinical circumstance.Additionally, there is some hyperechoic scattered area on the edges in certain angles that would correspond with torn muscle fibers or tendon. This is clearly not a full-thickness medial or lateral gastrocnemius rupture.  Assessment and Plan:   Gastrocnemius muscle rupture, right, initial encounter  >25 minutes spent in face to face time with patient, >50% spent in counselling or coordination of care: i reviewed the anatomy with the patient. This case is interesting due to the location of the tear. The vast majority of these tears are near the musculotendinous junction more distally. He has done remarkably well in a short amount of time. I think he is going to do  very well. Rest for the next month to begin scar formation. After this time, we will begin rehabilitation. I also recommended some compression for right now.  I appreciate the opportunity to evaluate this very friendly patient. If you  have any question regarding her care or prognosis, do not hesitate to ask.   Follow-up: Return in about 5 weeks (around 12/12/2013).  Signed,  Maud Deed. Kanyia Heaslip, MD   Patient's Medications  New Prescriptions   No medications on file  Previous Medications   ASPIRIN 81 MG TABLET    Take 81 mg by mouth daily.   FINASTERIDE (PROSCAR) 5 MG TABLET    Take 5 mg by mouth daily.   PNEUMOCOCCAL 13-VALENT CONJUGATE VACCINE (PREVNAR 13) SUSP INJECTION    Inject 0.5 mLs into the muscle once.   PSEUDOEPHEDRINE (SUDAFED) 30 MG TABLET    Take 30 mg by mouth every 4 (four) hours as needed.   TDAP (BOOSTRIX) 5-2.5-18.5 LF-MCG/0.5 INJECTION    Inject 0.5 mLs into the muscle once.   ZOSTER VACCINE LIVE, PF, (ZOSTAVAX) 29476 UNT/0.65ML INJECTION    Inject 19,400 Units into the skin once.  Modified Medications   No medications on file  Discontinued Medications   No medications on file

## 2013-11-27 DIAGNOSIS — Z23 Encounter for immunization: Secondary | ICD-10-CM | POA: Diagnosis not present

## 2013-12-01 DIAGNOSIS — Z23 Encounter for immunization: Secondary | ICD-10-CM | POA: Diagnosis not present

## 2013-12-12 ENCOUNTER — Ambulatory Visit: Payer: Medicare Other | Admitting: Family Medicine

## 2013-12-19 IMAGING — CR DG KNEE COMPLETE 4+V*L*
5 series · 5 of 5 positions shown · non-contrast
Comparison: None.

CLINICAL DATA: History of knee pain.  History of clicking.  History
of previous partial meniscal tear.

LEFT KNEE - COMPLETE 4+ VIEW

[view not recorded (1 of 5)]
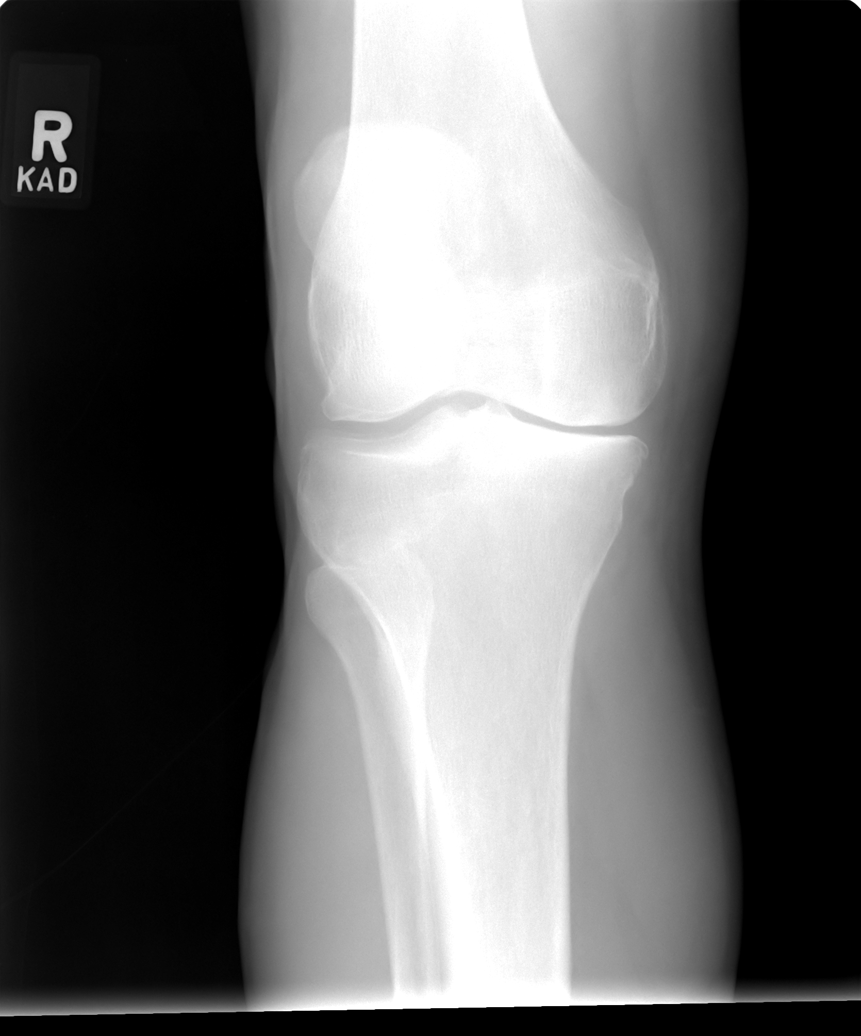

[view not recorded (2 of 5)]
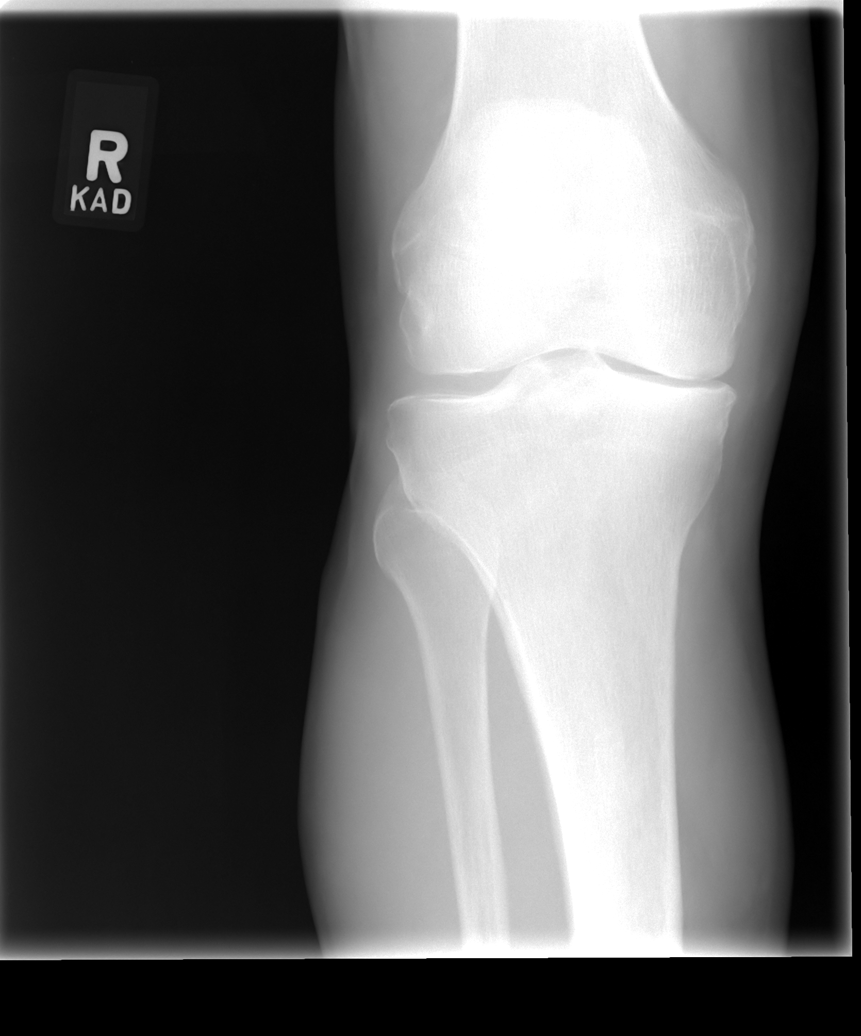

[view not recorded (3 of 5)]
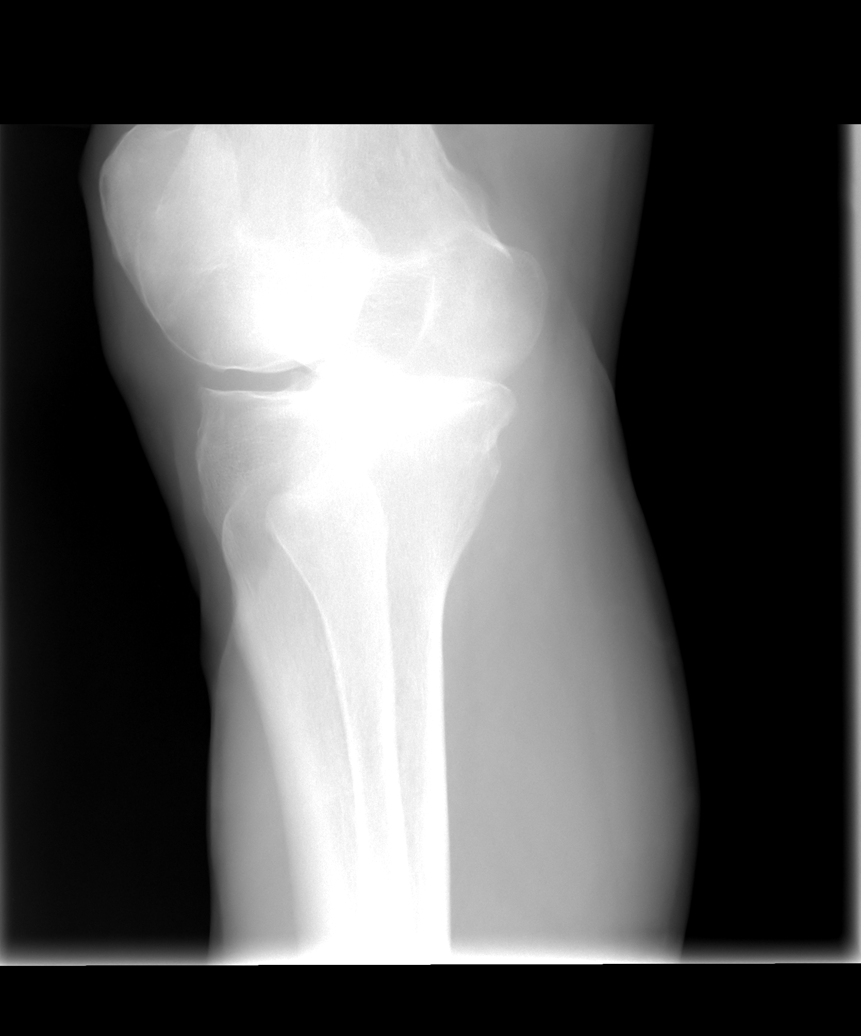

[view not recorded (4 of 5)]
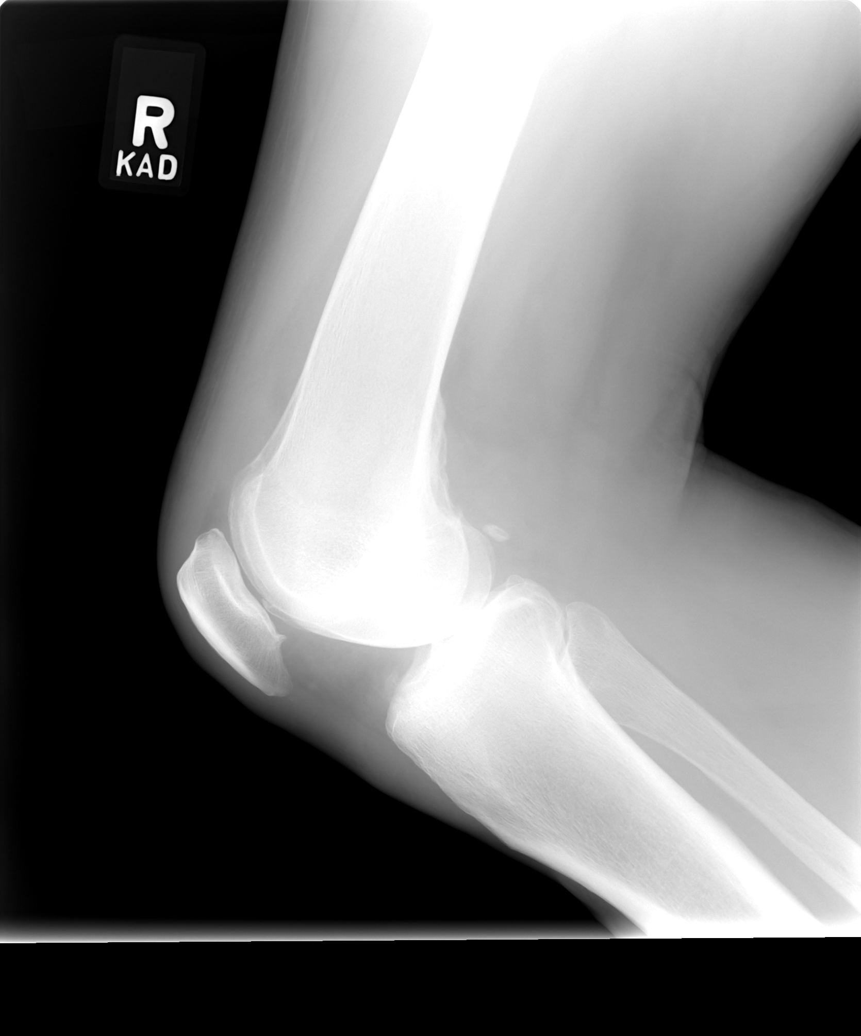

[view not recorded (5 of 5)]
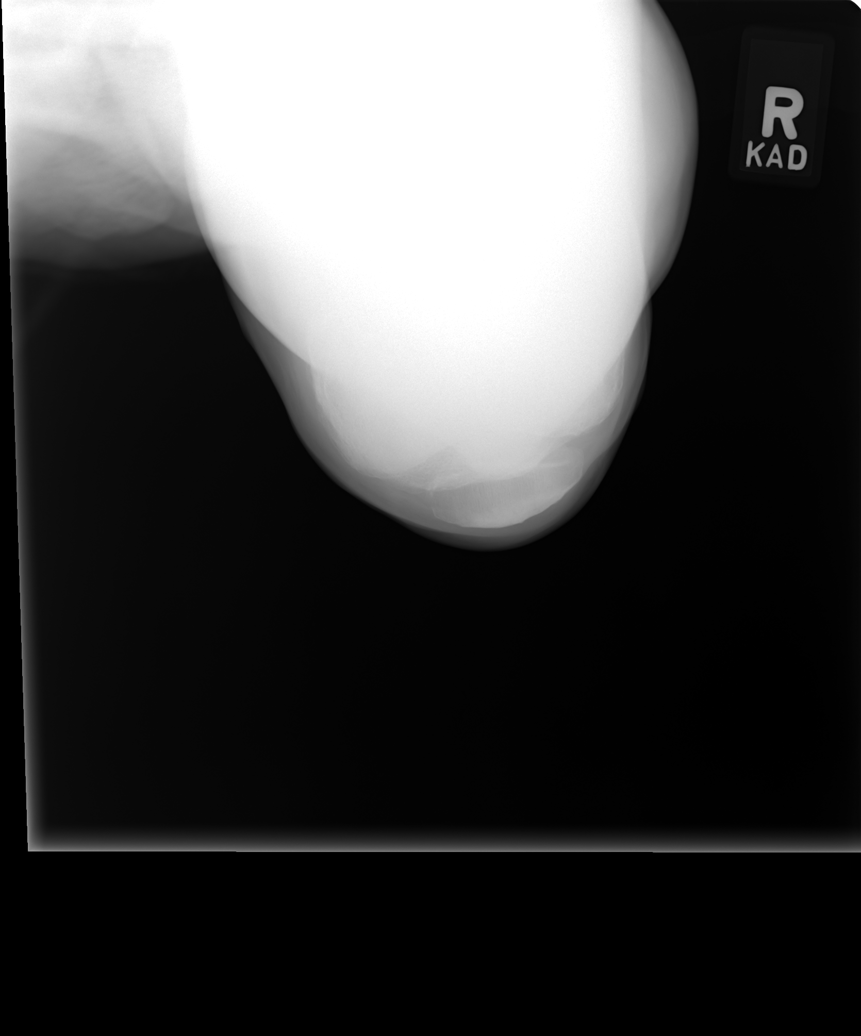

[5 of 5 positions shown; findings below may reference images not displayed]

FINDINGS: No definite joint effusion is seen.  Alignment is normal.
There is narrowing of the medial joint space. There is minimal
beginning degenerative spurring with sclerosis of medial tibial
plateau.  Minimal posterior patellar osteophyte formation is seen.
IMPRESSION: Minimal posterior patellar spurring.  Narrowing of medial joint
space with cortical sclerosis consistent with degenerative joint
changes..

## 2014-03-07 ENCOUNTER — Encounter: Payer: Self-pay | Admitting: *Deleted

## 2014-03-21 NOTE — Telephone Encounter (Signed)
Mailed unread message to pt  

## 2014-06-11 DIAGNOSIS — Z7982 Long term (current) use of aspirin: Secondary | ICD-10-CM | POA: Diagnosis not present

## 2014-06-11 DIAGNOSIS — R972 Elevated prostate specific antigen [PSA]: Secondary | ICD-10-CM | POA: Diagnosis not present

## 2014-06-11 DIAGNOSIS — C61 Malignant neoplasm of prostate: Secondary | ICD-10-CM | POA: Diagnosis not present

## 2014-06-11 DIAGNOSIS — N401 Enlarged prostate with lower urinary tract symptoms: Secondary | ICD-10-CM | POA: Diagnosis not present

## 2014-06-11 DIAGNOSIS — N3941 Urge incontinence: Secondary | ICD-10-CM | POA: Diagnosis not present

## 2014-06-28 NOTE — Op Note (Signed)
DATE OF BIRTH:  09/20/1945  DATE OF PROCEDURE:  05/19/2012  PREOPERATIVE DIAGNOSES: 1.  Incarcerated umbilical hernia.  2.  Recurrent right inguinal hernia.   POSTOPERATIVE DIAGNOSES: 1.  Incarcerated umbilical hernia, with preperitoneal fat. 2.  Recurrent right inguinal hernia, indirect variety.   OPERATION PERFORMED: Repair of incarcerated umbilical hernia and recurrent right inguinal hernia.   SURGEON:  Mckinley Jewel, MD   ANESTHESIA:  General.   COMPLICATIONS:  None.   ESTIMATED BLOOD LOSS:  Less than 20 mL.   DRAINS:  None.   PROCEDURE:  The patient was put to sleep in the supine position on the operating table, and LMA was utilized. The umbilical and right groin area were prepped and draped out as sterile field. The umbilical hernia was repaired first, 0.5% Marcaine was instilled for postoperative analgesia overlying the hernia. A transverse incision along the inferior lip of the umbilicus was made and carefully deepened into the subcutaneous tissue. The hernial protrusion was then identified and freed from overlying skin surrounding soft subcutaneous tissue down to the fascial opening. This was approximately a 3 cm, rounded, preperitoneal fatty tissue, and this could not be reduced back into the abdominal cavity. It was, therefore, sequentially clamped, cut and ligated close to the fascial opening with 3-0 Vicryl, and the remaining parts were easily pushed back in. The excised fatty tissue did not contain a sac, but it did have some firmness suggesting chronic incarceration and mild damage to this. The fascial opening was measured, and measured only about 7to 8 mm in size. It was then repaired with a simple 0 Prolene stitch using a figure-of-eight configuration. It was satisfactorily closed. The wound was irrigated, 3-0 Vicryl placed in the subcutaneous tissue, skin closed with subcuticular 4-0 Vicryl covered with Dermabond.   The right inguinal hernia was then operated on. The  patient had a previous laparoscopic repair some years ago. Local anesthetic was again instilled for postop analgesia. A skin incision was made along the medial two-thirds of the inguinal canal and deepened through the layers down to the external oblique. Bleeding was controlled with cautery and ligatures of 3-0 Vicryl. The external oblique opened along the line of its fibers. The cord was identified and the contents in the inguinal canal were inspected. There was no direct hernia noted, but in the posterior wall an edge of this somewhat thickened, hard fascia placed in the retroperitoneum was palpable. It did not appear that there was any herniation of this into the external ring. The cord was dissected free, and the fascia covering this was opened to reveal the presence of a fairly sizable indirect sac, with no contents at this time. The sac was freed all the way from the surrounding cord structures down to the neck, and a high ligation was then performed with 3-0 Vicryl and amputated above this. The same stitch was used to transfix this to the undersurface of the arching fibers of the internal oblique. A ProGrip mesh was then brought up to the field. It was cut along the inferior edge laterally to allow for approximation to the small inguinal canal. It was then placed around the internal ring and settled down into the posterior wall. The medial end was then tacked to the pubic tubercle with a single 2-0 PDS stitch. The lateral ends were tucked underneath the external oblique. The ilioinguinal nerve was noted to be lying along the spermatic cord, and this was left in place as is. The wound was irrigated and closed.  The external oblique was closed with a running 2-0 PDS. Subcutaneous tissue closed with 3-0 Vicryl and the skin closed with subcuticular 4-0 Vicryl covered with Dermabond. The procedure was well-tolerated. He was subsequently extubated and returned to the recovery room in stable condition.     ____________________________ S.Robinette Haines, MD sgs:mr D: 05/19/2012 15:39:33 ET T: 05/20/2012 13:04:21 ET JOB#: [id number]  cc: Synthia Innocent. Jamal Collin, MD, <Dictator> [facility] Robinette Haines MD ELECTRONICALLY SIGNED 05/22/2012 18:11

## 2014-08-13 ENCOUNTER — Encounter: Payer: Self-pay | Admitting: Internal Medicine

## 2014-08-16 ENCOUNTER — Ambulatory Visit (INDEPENDENT_AMBULATORY_CARE_PROVIDER_SITE_OTHER): Payer: Medicare Other | Admitting: Nurse Practitioner

## 2014-08-16 VITALS — BP 110/60 | HR 59 | Temp 98.4°F | Resp 18 | Ht 67.0 in | Wt 161.4 lb

## 2014-08-16 DIAGNOSIS — S90822A Blister (nonthermal), left foot, initial encounter: Secondary | ICD-10-CM | POA: Diagnosis not present

## 2014-08-16 NOTE — Progress Notes (Signed)
Pre visit review using our clinic review tool, if applicable. No additional management support is needed unless otherwise documented below in the visit note. 

## 2014-08-16 NOTE — Patient Instructions (Signed)
Call us if fever, swelling, pus, pain.   Keep doing what you are doing.

## 2014-08-16 NOTE — Progress Notes (Signed)
   Subjective:    Patient ID: Ethan Singleton, male    DOB: [birth year], 69 y.o.   MRN: [id number]  HPI  [name] is a 69 yo male with a CC of left foot arch   1) Yellow clear fluid, blister on arch of foot, denies stepping on hot surface or trauma to arch of foot. He was walking a lot in [place] and it popped oozing clear to straw colored liquid and small amount of blood. He is concerned that it will get infected.   Review of Systems  Constitutional: Negative for fever, chills, diaphoresis and fatigue.  Eyes: Negative for visual disturbance.  Respiratory: Negative for chest tightness, shortness of breath and wheezing.   Cardiovascular: Negative for chest pain, palpitations and leg swelling.  Skin: Positive for wound.  Neurological: Negative for dizziness, weakness and numbness.  Psychiatric/Behavioral: The patient is not nervous/anxious.       Objective:   Physical Exam  Constitutional: He appears well-developed and well-nourished. No distress.  BP 110/60 mmHg  Pulse 59  Temp(Src) 98.4 F (36.9 C)  Resp 18  Ht 5\' 7"  (1.702 m)  Wt 161 lb 6.4 oz (73.211 kg)  BMI 25.27 kg/m2  SpO2 97%   HENT:  Head: Normocephalic and atraumatic.  Right Ear: External ear normal.  Left Ear: External ear normal.  Eyes: EOM are normal. Pupils are equal, round, and reactive to light. Right eye exhibits no discharge. Left eye exhibits no discharge. No scleral icterus.  Skin: Skin is warm and dry. No rash noted. He is not diaphoretic. No erythema.  Bottom of left foot, flat extra skin looking like a popped blister, dark red, still draining slight amount of serosanguinous fluid. Non tender, no signs of infection. Area is circumferential and approx 1.5 inches in diameter.   Psychiatric: He has a normal mood and affect. His behavior is normal. Judgment and thought content normal.      Assessment & Plan:

## 2014-08-24 ENCOUNTER — Encounter: Payer: Self-pay | Admitting: Nurse Practitioner

## 2014-08-24 DIAGNOSIS — S90829A Blister (nonthermal), unspecified foot, initial encounter: Secondary | ICD-10-CM | POA: Insufficient documentation

## 2014-08-24 NOTE — Assessment & Plan Note (Signed)
Blister is looking good. No sign of infection. Pt was given signs to call us and follow up if they appear. Continue home care.

## 2014-12-23 ENCOUNTER — Other Ambulatory Visit: Payer: Self-pay | Admitting: Internal Medicine

## 2014-12-23 ENCOUNTER — Ambulatory Visit (INDEPENDENT_AMBULATORY_CARE_PROVIDER_SITE_OTHER): Payer: Medicare Other | Admitting: Internal Medicine

## 2014-12-23 VITALS — BP 112/60 | HR 84 | Temp 98.1°F | Ht 67.0 in | Wt 157.1 lb

## 2014-12-23 DIAGNOSIS — Z0000 Encounter for general adult medical examination without abnormal findings: Secondary | ICD-10-CM | POA: Diagnosis not present

## 2014-12-23 DIAGNOSIS — E785 Hyperlipidemia, unspecified: Secondary | ICD-10-CM

## 2014-12-23 DIAGNOSIS — Z8546 Personal history of malignant neoplasm of prostate: Secondary | ICD-10-CM

## 2014-12-23 DIAGNOSIS — R5383 Other fatigue: Secondary | ICD-10-CM

## 2014-12-23 DIAGNOSIS — E559 Vitamin D deficiency, unspecified: Secondary | ICD-10-CM

## 2014-12-23 DIAGNOSIS — Z113 Encounter for screening for infections with a predominantly sexual mode of transmission: Secondary | ICD-10-CM

## 2014-12-23 LAB — COMPREHENSIVE METABOLIC PANEL
ALK PHOS: 44 U/L (ref 39–117)
ALT: 18 U/L (ref 0–53)
AST: 20 U/L (ref 0–37)
Albumin: 4.1 g/dL (ref 3.5–5.2)
BILIRUBIN TOTAL: 1 mg/dL (ref 0.2–1.2)
BUN: 15 mg/dL (ref 6–23)
CALCIUM: 9.2 mg/dL (ref 8.4–10.5)
CHLORIDE: 108 meq/L (ref 96–112)
CO2: 25 mEq/L (ref 19–32)
CREATININE: 0.84 mg/dL (ref 0.40–1.50)
GFR: 96.32 mL/min (ref >60.00)
Glucose, Bld: 98 mg/dL (ref 70–99)
Potassium: 4 mEq/L (ref 3.5–5.1)
Sodium: 141 mEq/L (ref 135–145)
Total Protein: 6.5 g/dL (ref 6.0–8.3)

## 2014-12-23 LAB — VITAMIN D 25 HYDROXY (VIT D DEFICIENCY, FRACTURES): VITD: 43.04 ng/mL (ref 30.00–100.00)

## 2014-12-23 NOTE — Patient Instructions (Signed)
Tell your daughter that you need the following vaccines:  TDaP Prevnar Influenza  You should not get all 3 at once; get the TDap and the flu ,  Followed by the Prevnar at the end of the week   Health Maintenance, Male A healthy lifestyle and preventative care can promote health and wellness.  Maintain regular health, dental, and eye exams.  Eat a healthy diet. Foods like vegetables, fruits, whole grains, low-fat dairy products, and lean protein foods contain the nutrients you need and are low in calories. Decrease your intake of foods high in solid fats, added sugars, and salt. Get information about a proper diet from your health care provider, if necessary.  Regular physical exercise is one of the most important things you can do for your health. Most adults should get at least 150 minutes of moderate-intensity exercise (any activity that increases your heart rate and causes you to sweat) each week. In addition, most adults need muscle-strengthening exercises on 2 or more days a week.   Maintain a healthy weight. The body mass index (BMI) is a screening tool to identify possible weight problems. It provides an estimate of body fat based on height and weight. Your health care provider can find your BMI and can help you achieve or maintain a healthy weight. For males 20 years and older:  A BMI below 18.5 is considered underweight.  A BMI of 18.5 to 24.9 is normal.  A BMI of 25 to 29.9 is considered overweight.  A BMI of 30 and above is considered obese.  Maintain normal blood lipids and cholesterol by exercising and minimizing your intake of saturated fat. Eat a balanced diet with plenty of fruits and vegetables. Blood tests for lipids and cholesterol should begin at age 69 and be repeated every 5 years. If your lipid or cholesterol levels are high, you are over age 69, or you are at high risk for heart disease, you may need your cholesterol levels checked more frequently.Ongoing high  lipid and cholesterol levels should be treated with medicines if diet and exercise are not working.  If you smoke, find out from your health care provider how to quit. If you do not use tobacco, do not start.  Lung cancer screening is recommended for adults aged 99-80 years who are at high risk for developing lung cancer because of a history of smoking. A yearly low-dose CT scan of the lungs is recommended for people who have at least a 30-pack-year history of smoking and are current smokers or have quit within the past 15 years. A pack year of smoking is smoking an average of 1 pack of cigarettes a day for 1 year (for example, a 30-pack-year history of smoking could mean smoking 1 pack a day for 30 years or 2 packs a day for 15 years). Yearly screening should continue until the smoker has stopped smoking for at least 15 years. Yearly screening should be stopped for people who develop a health problem that would prevent them from having lung cancer treatment.  If you choose to drink alcohol, do not have more than 2 drinks per day. One drink is considered to be 12 oz (360 mL) of beer, 5 oz (150 mL) of wine, or 1.5 oz (45 mL) of liquor.  Avoid the use of street drugs. Do not share needles with anyone. Ask for help if you need support or instructions about stopping the use of drugs.  High blood pressure causes heart disease and increases the  risk of stroke. High blood pressure is more likely to develop in:  People who have blood pressure in the end of the normal range (100-139/85-89 mm Hg).  People who are overweight or obese.  People who are African American.  If you are 81-61 years of age, have your blood pressure checked every 3-5 years. If you are 36 years of age or older, have your blood pressure checked every year. You should have your blood pressure measured twice--once when you are at a hospital or clinic, and once when you are not at a hospital or clinic. Record the average of the two  measurements. To check your blood pressure when you are not at a hospital or clinic, you can use:  An automated blood pressure machine at a pharmacy.  A home blood pressure monitor.  If you are 53-40 years old, ask your health care provider if you should take aspirin to prevent heart disease.  Diabetes screening involves taking a blood sample to check your fasting blood sugar level. This should be done once every 3 years after age 26 if you are at a normal weight and without risk factors for diabetes. Testing should be considered at a younger age or be carried out more frequently if you are overweight and have at least 1 risk factor for diabetes.  Colorectal cancer can be detected and often prevented. Most routine colorectal cancer screening begins at the age of 11 and continues through age 79. However, your health care provider may recommend screening at an earlier age if you have risk factors for colon cancer. On a yearly basis, your health care provider may provide home test kits to check for hidden blood in the stool. A small camera at the end of a tube may be used to directly examine the colon (sigmoidoscopy or colonoscopy) to detect the earliest forms of colorectal cancer. Talk to your health care provider about this at age 53 when routine screening begins. A direct exam of the colon should be repeated every 5-10 years through age 46, unless early forms of precancerous polyps or small growths are found.  People who are at an increased risk for hepatitis B should be screened for this virus. You are considered at high risk for hepatitis B if:  You were born in a country where hepatitis B occurs often. Talk with your health care provider about which countries are considered high risk.  Your parents were born in a high-risk country and you have not received a shot to protect against hepatitis B (hepatitis B vaccine).  You have HIV or AIDS.  You use needles to inject street drugs.  You live  with, or have sex with, someone who has hepatitis B.  You are a man who has sex with other men (MSM).  You get hemodialysis treatment.  You take certain medicines for conditions like cancer, organ transplantation, and autoimmune conditions.  Hepatitis C blood testing is recommended for all people born from [birth year] through 1965 and any individual with known risk factors for hepatitis C.  Healthy men should no longer receive prostate-specific antigen (PSA) blood tests as part of routine cancer screening. Talk to your health care provider about prostate cancer screening.  Testicular cancer screening is not recommended for adolescents or adult males who have no symptoms. Screening includes self-exam, a health care provider exam, and other screening tests. Consult with your health care provider about any symptoms you have or any concerns you have about testicular cancer.  Practice safe  sex. Use condoms and avoid high-risk sexual practices to reduce the spread of sexually transmitted infections (STIs).  You should be screened for STIs, including gonorrhea and chlamydia if:  You are sexually active and are younger than 24 years.  You are older than 24 years, and your health care provider tells you that you are at risk for this type of infection.  Your sexual activity has changed since you were last screened, and you are at an increased risk for chlamydia or gonorrhea. Ask your health care provider if you are at risk.  If you are at risk of being infected with HIV, it is recommended that you take a prescription medicine daily to prevent HIV infection. This is called pre-exposure prophylaxis (PrEP). You are considered at risk if:  You are a man who has sex with other men (MSM).  You are a heterosexual man who is sexually active with multiple partners.  You take drugs by injection.  You are sexually active with a partner who has HIV.  Talk with your health care provider about whether you are at  high risk of being infected with HIV. If you choose to begin PrEP, you should first be tested for HIV. You should then be tested every 3 months for as long as you are taking PrEP.  Use sunscreen. Apply sunscreen liberally and repeatedly throughout the day. You should seek shade when your shadow is shorter than you. Protect yourself by wearing long sleeves, pants, a wide-brimmed hat, and sunglasses year round whenever you are outdoors.  Tell your health care provider of new moles or changes in moles, especially if there is a change in shape or color. Also, tell your health care provider if a mole is larger than the size of a pencil eraser.  A one-time screening for abdominal aortic aneurysm (AAA) and surgical repair of large AAAs by ultrasound is recommended for men aged 54-75 years who are current or former smokers.  Stay current with your vaccines (immunizations).   This information is not intended to replace advice given to you by your health care provider. Make sure you discuss any questions you have with your health care provider.   Document Released: 08/21/2007 Document Revised: 03/15/2014 Document Reviewed: 07/20/2010 Elsevier Interactive Patient Education [employer].

## 2014-12-23 NOTE — Progress Notes (Signed)
Patient ID: Ethan Singleton, male    DOB: Aug 14, 1945  Age: 69 y.o. MRN: [id number]  The patient is here for annual Medicare wellness examination and management of other chronic and acute problems.   The risk factors are reflected in the social history.  The roster of all physicians providing medical care to patient - is listed in the Snapshot section of the chart.  Activities of daily living:  The patient is 100% independent in all ADLs: dressing, toileting, feeding as well as independent mobility  Home safety : The patient has smoke detectors in the home. They wear seatbelts.  There are no firearms at home. There is no violence in the home.   There is no risks for hepatitis, STDs or HIV. There is no   history of blood transfusion. They have no travel history to infectious disease endemic areas of the world.  The patient has seen their dentist in the last six month. They have seen their eye doctor in the last year. They admit to slight hearing difficulty with regard to whispered voices and some television programs.  They have deferred audiologic testing in the last year.  They do not  have excessive sun exposure. Discussed the need for sun protection: hats, long sleeves and use of sunscreen if there is significant sun exposure.   Diet: the importance of a healthy diet is discussed. They do have a healthy diet.  The benefits of regular aerobic exercise were discussed. She walks 4 times per week ,  20 minutes.   Depression screen: there are no signs or vegative symptoms of depression- irritability, change in appetite, anhedonia, sadness/tearfullness.  Cognitive assessment: the patient manages all their financial and personal affairs and is actively engaged. They could relate day,date,year and events; recalled 2/3 objects at 3 minutes; performed clock-face test normally.  The following portions of the patient's history were reviewed and updated as appropriate: allergies, current medications, past  family history, past medical history,  past surgical history, past social history  and problem list.  Visual acuity was not assessed per patient preference since she has regular follow up with her ophthalmologist. Hearing and body mass index were assessed and reviewed.   During the course of the visit the patient was educated and counseled about appropriate screening and preventive services including : fall prevention , diabetes screening, nutrition counseling, colorectal cancer screening, and recommended immunizations.    CC: The primary encounter diagnosis was Screen for STD (sexually transmitted disease). Diagnoses of Hyperlipidemia, Other fatigue, Vitamin D deficiency, Encounter for Medicare annual wellness exam, and Personal history of prostate cancer were also pertinent to this visit.  Prostate CA managed with watchful waiting.  Last PSA was 4.0  On finasteride 5 mg ,  Next follow up in January with Dr. 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   He has past surgical history that includes meniscotomy; Hernia repair (2006); Hernia repair (2002); Prostate biopsy; and Umbilical hernia repair (05/19/12).   His family history includes Cancer in his sister; Heart disease in his father; Hypertension in his mother.He reports that he has never smoked. He has never used smokeless tobacco. He reports that he drinks about  1.2 - 1.8 oz of alcohol per week. He reports that he does not use illicit drugs.  Outpatient Prescriptions Prior to Visit  Medication Sig Dispense Refill  . aspirin 81 MG tablet Take 81 mg by mouth daily.    . finasteride (PROSCAR) 5 MG tablet Take 5 mg by mouth  daily.    . pneumococcal 13-valent conjugate vaccine (PREVNAR 13) SUSP injection Inject 0.5 mLs into the muscle once. 0.5 mL 0  . pseudoephedrine (SUDAFED) 30 MG tablet Take 30 mg by mouth every 4 (four) hours as needed.    . Tdap (BOOSTRIX) 5-2.5-18.5 LF-MCG/0.5 injection Inject 0.5 mLs into the muscle once. 0.5 mL 0  . zoster vaccine live, PF, (ZOSTAVAX) 08676 UNT/0.65ML injection Inject 19,400 Units into the skin once. 1 each 0   No facility-administered medications prior to visit.    Review of Systems   Patient denies headache, fevers, malaise, unintentional weight loss, skin rash, eye pain, sinus congestion and sinus pain, sore throat, dysphagia,  hemoptysis , cough, dyspnea, wheezing, chest pain, palpitations, orthopnea, edema, abdominal pain, nausea, melena, diarrhea, constipation, flank pain, dysuria, hematuria, urinary  Frequency, nocturia, numbness, tingling, seizures,  Focal weakness, Loss of consciousness,  Tremor, insomnia, depression, anxiety, and suicidal ideation.     Objective:  BP 112/60 mmHg  Pulse 84  Temp(Src) 98.1 F (36.7 C) (Oral)  Ht 5\' 7"  (1.702 m)  Wt 157 lb 1.9 oz (71.269 kg)  BMI 24.60 kg/m2  SpO2 96%  Physical Exam   General appearance: alert, cooperative and appears stated age Ears: normal TM's and external ear canals both ears Throat: lips, mucosa, and tongue normal; teeth and gums normal Neck: no adenopathy, no carotid bruit, supple, symmetrical, trachea midline and thyroid not enlarged, symmetric, no tenderness/mass/nodules Back: symmetric, no curvature. ROM normal. No CVA tenderness. Lungs: clear to auscultation bilaterally Heart: regular rate and rhythm, S1, S2 normal, no murmur, click, rub or gallop Abdomen: soft, non-tender; bowel sounds normal; no masses,  no organomegaly Pulses: 2+ and symmetric Skin: Skin color, texture, turgor normal. No rashes or lesions Lymph nodes: Cervical, supraclavicular, and axillary nodes normal.    Assessment  & Plan:   Problem List Items Addressed This Visit    Encounter for Medicare annual wellness exam    Annual Medicare wellness  exam was done as well as a comprehensive physical exam and management of acute and chronic conditions .  During the course of the visit the patient was educated and counseled about appropriate screening and preventive services including : fall prevention , diabetes screening, nutrition counseling, colorectal cancer screening, and recommended immunizations.  Printed recommendations for health maintenance screenings was given.       Personal history of prostate cancer    Prior biopsy 16 samples one was positive.,    2nd biopsy 6 months later was negative, watchful waiting ,  Annual follow up with [facility].  No results found for: PSA         Other Visit Diagnoses    Screen for STD (sexually transmitted disease)    -  Primary    Relevant Orders    HIV antibody (Completed)    Hyperlipidemia        Relevant Orders    Lipid panel    Other fatigue        Relevant Orders    CBC with Differential/Platelet    Comprehensive metabolic panel (Completed)    TSH    Vitamin D deficiency  Relevant Orders    Vit D  25 hydroxy (rtn osteoporosis monitoring) (Completed)       I am having Mr. Raczka maintain his pseudoephedrine, finasteride, aspirin, Tdap, zoster vaccine live (PF), and pneumococcal 13-valent conjugate vaccine.  No orders of the defined types were placed in this encounter.    There are no discontinued medications.  Follow-up: No Follow-up on file.   Crecencio Mc, MD

## 2014-12-24 ENCOUNTER — Encounter: Payer: Self-pay | Admitting: Internal Medicine

## 2014-12-24 LAB — HIV ANTIBODY (ROUTINE TESTING W REFLEX): HIV 1&2 Ab, 4th Generation: NONREACTIVE

## 2014-12-24 NOTE — Assessment & Plan Note (Signed)

## 2014-12-24 NOTE — Assessment & Plan Note (Signed)
Prior biopsy 16 samples one was positive.,    2nd biopsy 6 months later was negative, watchful waiting ,  Annual follow up with [facility].  No results found for: PSA

## 2014-12-28 LAB — LIPID PANEL
CHOL/HDL RATIO: 6.2 ratio — AB (ref <=5.0)
CHOLESTEROL: 242 mg/dL — AB (ref 125–200)
HDL: 39 mg/dL — ABNORMAL LOW (ref >=40)
LDL Cholesterol: 134 mg/dL — ABNORMAL HIGH (ref <130)
TRIGLYCERIDES: 345 mg/dL — AB (ref <150)
VLDL: 69 mg/dL — ABNORMAL HIGH (ref <30)

## 2014-12-28 LAB — TSH: TSH: 0.702 u[IU]/mL (ref 0.350–4.500)

## 2014-12-29 ENCOUNTER — Encounter: Payer: Self-pay | Admitting: Internal Medicine

## 2014-12-29 NOTE — Addendum Note (Signed)
Addended by: Crecencio Mc on: 12/29/2014 04:50 PM   Modules accepted: Orders

## 2015-01-08 DIAGNOSIS — Z23 Encounter for immunization: Secondary | ICD-10-CM | POA: Diagnosis not present

## 2015-01-12 ENCOUNTER — Encounter: Payer: Self-pay | Admitting: Internal Medicine

## 2015-11-24 DIAGNOSIS — Z23 Encounter for immunization: Secondary | ICD-10-CM | POA: Diagnosis not present

## 2016-01-22 ENCOUNTER — Ambulatory Visit (INDEPENDENT_AMBULATORY_CARE_PROVIDER_SITE_OTHER): Payer: Medicare Other | Admitting: Internal Medicine

## 2016-01-22 ENCOUNTER — Encounter: Payer: Self-pay | Admitting: Internal Medicine

## 2016-01-22 VITALS — BP 110/78 | HR 97 | Temp 98.1°F | Resp 12 | Ht 66.5 in | Wt 153.0 lb

## 2016-01-22 DIAGNOSIS — C61 Malignant neoplasm of prostate: Secondary | ICD-10-CM

## 2016-01-22 DIAGNOSIS — E785 Hyperlipidemia, unspecified: Secondary | ICD-10-CM

## 2016-01-22 DIAGNOSIS — R5383 Other fatigue: Secondary | ICD-10-CM | POA: Diagnosis not present

## 2016-01-22 DIAGNOSIS — Z8546 Personal history of malignant neoplasm of prostate: Secondary | ICD-10-CM

## 2016-01-22 DIAGNOSIS — Z0000 Encounter for general adult medical examination without abnormal findings: Secondary | ICD-10-CM | POA: Diagnosis not present

## 2016-01-22 LAB — COMPREHENSIVE METABOLIC PANEL
ALBUMIN: 4.1 g/dL (ref 3.5–5.2)
ALK PHOS: 42 U/L (ref 39–117)
ALT: 21 U/L (ref 0–53)
AST: 24 U/L (ref 0–37)
BILIRUBIN TOTAL: 1.1 mg/dL (ref 0.2–1.2)
BUN: 16 mg/dL (ref 6–23)
CO2: 23 mEq/L (ref 19–32)
CREATININE: 0.79 mg/dL (ref 0.40–1.50)
Calcium: 9 mg/dL (ref 8.4–10.5)
Chloride: 109 mEq/L (ref 96–112)
GFR: 103.06 mL/min (ref >60.00)
GLUCOSE: 96 mg/dL (ref 70–99)
Potassium: 4.2 mEq/L (ref 3.5–5.1)
SODIUM: 140 meq/L (ref 135–145)
TOTAL PROTEIN: 6.2 g/dL (ref 6.0–8.3)

## 2016-01-22 LAB — PSA, MEDICARE: PSA: 2.3 ng/mL (ref 0.10–4.00)

## 2016-01-22 LAB — LIPID PANEL
CHOLESTEROL: 196 mg/dL (ref 0–200)
HDL: 54.3 mg/dL (ref >39.00)
LDL Cholesterol: 118 mg/dL — ABNORMAL HIGH (ref 0–99)
NONHDL: 141.48
Total CHOL/HDL Ratio: 4
Triglycerides: 115 mg/dL (ref 0.0–149.0)
VLDL: 23 mg/dL (ref 0.0–40.0)

## 2016-01-22 NOTE — Patient Instructions (Signed)
I AM CHECKING YOUR PSA TODAY  (per Dr Dene Gentry notes from last April)  Please call his office to schedule follow up, unless a referral is needed  Get your eyes examined ASAP!  Health Maintenance, Male A healthy lifestyle and preventative care can promote health and wellness.  Maintain regular health, dental, and eye exams.  Eat a healthy diet. Foods like vegetables, fruits, whole grains, low-fat dairy products, and lean protein foods contain the nutrients you need and are low in calories. Decrease your intake of foods high in solid fats, added sugars, and salt. Get information about a proper diet from your health care provider, if necessary.  Regular physical exercise is one of the most important things you can do for your health. Most adults should get at least 150 minutes of moderate-intensity exercise (any activity that increases your heart rate and causes you to sweat) each week. In addition, most adults need muscle-strengthening exercises on 2 or more days a week.   Maintain a healthy weight. The body mass index (BMI) is a screening tool to identify possible weight problems. It provides an estimate of body fat based on height and weight. Your health care provider can find your BMI and can help you achieve or maintain a healthy weight. For males 20 years and older:  A BMI below 18.5 is considered underweight.  A BMI of 18.5 to 24.9 is normal.  A BMI of 25 to 29.9 is considered overweight.  A BMI of 30 and above is considered obese.  Maintain normal blood lipids and cholesterol by exercising and minimizing your intake of saturated fat. Eat a balanced diet with plenty of fruits and vegetables. Blood tests for lipids and cholesterol should begin at age 73 and be repeated every 5 years. If your lipid or cholesterol levels are high, you are over age 42, or you are at high risk for heart disease, you may need your cholesterol levels checked more frequently.Ongoing high lipid and cholesterol  levels should be treated with medicines if diet and exercise are not working.  If you smoke, find out from your health care provider how to quit. If you do not use tobacco, do not start.  Lung cancer screening is recommended for adults aged 66-80 years who are at high risk for developing lung cancer because of a history of smoking. A yearly low-dose CT scan of the lungs is recommended for people who have at least a 30-pack-year history of smoking and are current smokers or have quit within the past 15 years. A pack year of smoking is smoking an average of 1 pack of cigarettes a day for 1 year (for example, a 30-pack-year history of smoking could mean smoking 1 pack a day for 30 years or 2 packs a day for 15 years). Yearly screening should continue until the smoker has stopped smoking for at least 15 years. Yearly screening should be stopped for people who develop a health problem that would prevent them from having lung cancer treatment.  If you choose to drink alcohol, do not have more than 2 drinks per day. One drink is considered to be 12 oz (360 mL) of beer, 5 oz (150 mL) of wine, or 1.5 oz (45 mL) of liquor.  Avoid the use of street drugs. Do not share needles with anyone. Ask for help if you need support or instructions about stopping the use of drugs.  High blood pressure causes heart disease and increases the risk of stroke. High blood pressure is  more likely to develop in:  People who have blood pressure in the end of the normal range (100-139/85-89 mm Hg).  People who are overweight or obese.  People who are African American.  If you are 22-32 years of age, have your blood pressure checked every 3-5 years. If you are 36 years of age or older, have your blood pressure checked every year. You should have your blood pressure measured twice-once when you are at a hospital or clinic, and once when you are not at a hospital or clinic. Record the average of the two measurements. To check your  blood pressure when you are not at a hospital or clinic, you can use:  An automated blood pressure machine at a pharmacy.  A home blood pressure monitor.  If you are 63-19 years old, ask your health care provider if you should take aspirin to prevent heart disease.  Diabetes screening involves taking a blood sample to check your fasting blood sugar level. This should be done once every 3 years after age 45 if you are at a normal weight and without risk factors for diabetes. Testing should be considered at a younger age or be carried out more frequently if you are overweight and have at least 1 risk factor for diabetes.  Colorectal cancer can be detected and often prevented. Most routine colorectal cancer screening begins at the age of 46 and continues through age 77. However, your health care provider may recommend screening at an earlier age if you have risk factors for colon cancer. On a yearly basis, your health care provider may provide home test kits to check for hidden blood in the stool. A small camera at the end of a tube may be used to directly examine the colon (sigmoidoscopy or colonoscopy) to detect the earliest forms of colorectal cancer. Talk to your health care provider about this at age 48 when routine screening begins. A direct exam of the colon should be repeated every 5-10 years through age 5, unless early forms of precancerous polyps or small growths are found.  People who are at an increased risk for hepatitis B should be screened for this virus. You are considered at high risk for hepatitis B if:  You were born in a country where hepatitis B occurs often. Talk with your health care provider about which countries are considered high risk.  Your parents were born in a high-risk country and you have not received a shot to protect against hepatitis B (hepatitis B vaccine).  You have HIV or AIDS.  You use needles to inject street drugs.  You live with, or have sex with,  someone who has hepatitis B.  You are a man who has sex with other men (MSM).  You get hemodialysis treatment.  You take certain medicines for conditions like cancer, organ transplantation, and autoimmune conditions.  Hepatitis C blood testing is recommended for all people born from [birth year] through 1965 and any individual with known risk factors for hepatitis C.  Healthy men should no longer receive prostate-specific antigen (PSA) blood tests as part of routine cancer screening. Talk to your health care provider about prostate cancer screening.  Testicular cancer screening is not recommended for adolescents or adult males who have no symptoms. Screening includes self-exam, a health care provider exam, and other screening tests. Consult with your health care provider about any symptoms you have or any concerns you have about testicular cancer.  Practice safe sex. Use condoms and avoid high-risk sexual  practices to reduce the spread of sexually transmitted infections (STIs).  You should be screened for STIs, including gonorrhea and chlamydia if:  You are sexually active and are younger than 24 years.  You are older than 24 years, and your health care provider tells you that you are at risk for this type of infection.  Your sexual activity has changed since you were last screened, and you are at an increased risk for chlamydia or gonorrhea. Ask your health care provider if you are at risk.  If you are at risk of being infected with HIV, it is recommended that you take a prescription medicine daily to prevent HIV infection. This is called pre-exposure prophylaxis (PrEP). You are considered at risk if:  You are a man who has sex with other men (MSM).  You are a heterosexual man who is sexually active with multiple partners.  You take drugs by injection.  You are sexually active with a partner who has HIV.  Talk with your health care provider about whether you are at high risk of being  infected with HIV. If you choose to begin PrEP, you should first be tested for HIV. You should then be tested every 3 months for as long as you are taking PrEP.  Use sunscreen. Apply sunscreen liberally and repeatedly throughout the day. You should seek shade when your shadow is shorter than you. Protect yourself by wearing long sleeves, pants, a wide-brimmed hat, and sunglasses year round whenever you are outdoors.  Tell your health care provider of new moles or changes in moles, especially if there is a change in shape or color. Also, tell your health care provider if a mole is larger than the size of a pencil eraser.  A one-time screening for abdominal aortic aneurysm (AAA) and surgical repair of large AAAs by ultrasound is recommended for men aged 79-75 years who are current or former smokers.  Stay current with your vaccines (immunizations). This information is not intended to replace advice given to you by your health care provider. Make sure you discuss any questions you have with your health care provider. Document Released: 08/21/2007 Document Revised: 03/15/2014 Document Reviewed: 11/26/2014 Elsevier Interactive Patient Education  2017 [employer].

## 2016-01-22 NOTE — Progress Notes (Signed)
Pre-visit discussion using our clinic review tool. No additional management support is needed unless otherwise documented below in the visit note.  

## 2016-01-22 NOTE — Assessment & Plan Note (Signed)

## 2016-01-22 NOTE — Assessment & Plan Note (Signed)
He misunderstood Dr Dene Gentry plan an dis overdue for PSA and Urology follow up,.  He is still taking finasteride and still having nocutria 2-3 times per night.  PSa ordered and advised to make appt with Urology

## 2016-01-22 NOTE — Progress Notes (Signed)
Patient ID: Ethan Singleton, male    DOB: 11/15/1945  Age: 70 y.o. MRN: [id number]  The patient is here for annual Medicare wellness examination and management of other chronic and acute problems.    He is up to  date on colonoscopy, Hepatitis C screen and all immunizations  No results found for: PSA  DONE BY DR STOIOFF LAST April FOR  PROSTATE CA SURVEILLANCE but has not had one this year  Still taking  finasteride. Overdue for seeing Cope per last note  NOCTURIA UNCHANGED 2-3 PER NIGHT.  NO SLEEP ISSUES    NO EYE EXAM SINCE 2014   STILL WORKING PART TIME as a Software engineer for [employer].  His knees ache after porlonged standing  Or walking .   USES PRN MOTRIN    Hyperlipidemia:  Since ast year he has reduced his intake of red meat  And sweets and is eating more vegetables and fruit.   Has lost several  Lbs as a results,  Appetite excellent,     The risk factors are reflected in the social history.  The roster of all physicians providing medical care to patient - is listed in the Snapshot section of the chart.  Activities of daily living:  The patient is 100% independent in all ADLs: dressing, toileting, feeding as well as independent mobility  Home safety : The patient has smoke detectors in the home. They wear seatbelts.  There are no firearms at home. There is no violence in the home.   There is no risks for hepatitis, STDs or HIV. There is no   history of blood transfusion. They have no travel history to infectious disease endemic areas of the world.  The patient has seen their dentist in the last six month. They have seen their eye doctor in the last year. They admit to slight hearing difficulty with regard to whispered voices and some television programs.  They have deferred audiologic testing in the last year.  They do not  have excessive sun exposure. Discussed the need for sun protection: hats, long sleeves and use of sunscreen if there is significant sun exposure.   Diet: the importance  of a healthy diet is discussed. They do have a healthy diet.  The benefits of regular aerobic exercise were discussed. She walks 4 times per week ,  20 minutes.   Depression screen: there are no signs or vegative symptoms of depression- irritability, change in appetite, anhedonia, sadness/tearfullness.  Cognitive assessment: the patient manages all their financial and personal affairs and is actively engaged. They could relate day,date,year and events; recalled 2/3 objects at 3 minutes; performed clock-face test normally.  The following portions of the patient's history were reviewed and updated as appropriate: allergies, current medications, past family history, past medical history,  past surgical history, past social history  and problem list.  Visual acuity was not assessed per patient preference since she has regular follow up with her ophthalmologist. Hearing and body mass index were assessed and reviewed.   During the course of the visit the patient was educated and counseled about appropriate screening and preventive services including : fall prevention , diabetes screening, nutrition counseling, colorectal cancer screening, and recommended immunizations.    CC: The primary encounter diagnosis was Prostate cancer ([place]). Diagnoses of Hyperlipidemia LDL goal <100, Fatigue, unspecified type, Encounter for Medicare annual wellness exam, and Personal history of prostate cancer were also pertinent to this visit.  History Ethan Singleton has a past medical history of Arthritis (  2013); Inguinal hernia with obstruction, without mention of gangrene, recurrent unilateral or unspecified (2014); prostate cancer; and Umbilical hernia with obstruction (2014).   He has a past surgical history that includes meniscotomy; Hernia repair (2006); Hernia repair (2002); Prostate biopsy; and Umbilical hernia repair (05/19/12).   His family history includes Cancer in his sister; Heart disease in his father; Hypertension in  his mother.He reports that he has never smoked. He has never used smokeless tobacco. He reports that he drinks about 1.2 - 1.8 oz of alcohol per week . He reports that he does not use drugs.  Outpatient Medications Prior to Visit  Medication Sig Dispense Refill  . aspirin 81 MG tablet Take 81 mg by mouth daily.    . finasteride (PROSCAR) 5 MG tablet Take 5 mg by mouth daily.    . pneumococcal 13-valent conjugate vaccine (PREVNAR 13) SUSP injection Inject 0.5 mLs into the muscle once. 0.5 mL 0  . pseudoephedrine (SUDAFED) 30 MG tablet Take 30 mg by mouth every 4 (four) hours as needed.    . Tdap (BOOSTRIX) 5-2.5-18.5 LF-MCG/0.5 injection Inject 0.5 mLs into the muscle once. 0.5 mL 0  . zoster vaccine live, PF, (ZOSTAVAX) 16109 UNT/0.65ML injection Inject 19,400 Units into the skin once. 1 each 0   No facility-administered medications prior to visit.     Review of Systems   Patient denies headache, fevers, malaise, unintentional weight loss, skin rash, eye pain, sinus congestion and sinus pain, sore throat, dysphagia,  hemoptysis , cough, dyspnea, wheezing, chest pain, palpitations, orthopnea, edema, abdominal pain, nausea, melena, diarrhea, constipation, flank pain, dysuria, hematuria, urinary  Frequency, nocturia, numbness, tingling, seizures,  Focal weakness, Loss of consciousness,  Tremor, insomnia, depression, anxiety, and suicidal ideation.      Objective:  BP 110/78   Pulse 97   Temp 98.1 F (36.7 C) (Oral)   Resp 12   Ht 5' 6.5" (1.689 m)   Wt 153 lb (69.4 kg)   SpO2 97%   BMI 24.32 kg/m   Physical Exam   General appearance: alert, cooperative and appears stated age Ears: normal TM's and external ear canals both ears Throat: lips, mucosa, and tongue normal; teeth and gums normal Neck: no adenopathy, no carotid bruit, supple, symmetrical, trachea midline and thyroid not enlarged, symmetric, no tenderness/mass/nodules Back: symmetric, no curvature. ROM normal. No CVA  tenderness. Lungs: clear to auscultation bilaterally Heart: regular rate and rhythm, S1, S2 normal, no murmur, click, rub or gallop Abdomen: soft, non-tender; bowel sounds normal; no masses,  no organomegaly Pulses: 2+ and symmetric Skin: Skin color, texture, turgor normal. No rashes or lesions Lymph nodes: Cervical, supraclavicular, and axillary nodes normal. Ext:  crepitus  right knee no joint effusions.   Assessment & Plan:   Problem List Items Addressed This Visit    Encounter for Medicare annual wellness exam    Annual Medicare wellness  exam was done as well as a comprehensive physical exam and management of acute and chronic conditions .  During the course of the visit the patient was educated and counseled about appropriate screening and preventive services including : fall prevention , diabetes screening, nutrition counseling, colorectal cancer screening, and recommended immunizations.  Printed recommendations for health maintenance screenings was given.       Personal history of prostate cancer    He misunderstood Dr Dene Gentry plan an dis overdue for PSA and Urology follow up,.  He is still taking finasteride and still having nocutria 2-3 times per night.  PSa  ordered and advised to make appt with Urology       Other Visit Diagnoses    Prostate cancer [facility])    -  Primary   Relevant Orders   PSA, Medicare   Hyperlipidemia LDL goal <100       Relevant Orders   Lipid panel   Fatigue, unspecified type       Relevant Orders   Comprehensive metabolic panel      I have discontinued Mr. Loredo pseudoephedrine, Tdap, zoster vaccine live (PF), and pneumococcal 13-valent conjugate vaccine. I am also having him maintain his finasteride, aspirin, Fish Oil, Turmeric, and Vitamin D-3.  Meds ordered this encounter  Medications  . Omega-3 Fatty Acids (FISH OIL) 1200 MG CAPS    Sig: Take 1 capsule by mouth daily.  . Turmeric 500 MG CAPS    Sig: Take 1 capsule by mouth daily.  .  Cholecalciferol (VITAMIN D-3) 1000 units CAPS    Sig: Take 1 capsule by mouth daily.    Medications Discontinued During This Encounter  Medication Reason  . pneumococcal 13-valent conjugate vaccine (PREVNAR 13) SUSP injection Error  . Tdap (BOOSTRIX) 5-2.5-18.5 LF-MCG/0.5 injection Completed Course  . zoster vaccine live, PF, (ZOSTAVAX) 60454 UNT/0.65ML injection Completed Course  . pseudoephedrine (SUDAFED) 30 MG tablet Error    Follow-up: No Follow-up on file.   Crecencio Mc, MD

## 2016-01-25 ENCOUNTER — Encounter: Payer: Self-pay | Admitting: Internal Medicine

## 2016-02-04 ENCOUNTER — Telehealth: Payer: Self-pay | Admitting: Internal Medicine

## 2016-02-04 NOTE — Telephone Encounter (Signed)
Pt states he does not want to have the AWV this year. Thank you!

## 2016-04-13 DIAGNOSIS — M25562 Pain in left knee: Secondary | ICD-10-CM | POA: Diagnosis not present

## 2016-04-13 DIAGNOSIS — M25561 Pain in right knee: Secondary | ICD-10-CM | POA: Diagnosis not present

## 2016-04-13 DIAGNOSIS — M170 Bilateral primary osteoarthritis of knee: Secondary | ICD-10-CM | POA: Diagnosis not present

## 2016-04-13 DIAGNOSIS — R262 Difficulty in walking, not elsewhere classified: Secondary | ICD-10-CM | POA: Diagnosis not present

## 2016-04-20 DIAGNOSIS — M1711 Unilateral primary osteoarthritis, right knee: Secondary | ICD-10-CM | POA: Diagnosis not present

## 2016-04-20 DIAGNOSIS — M25561 Pain in right knee: Secondary | ICD-10-CM | POA: Diagnosis not present

## 2016-04-21 DIAGNOSIS — M25562 Pain in left knee: Secondary | ICD-10-CM | POA: Diagnosis not present

## 2016-04-21 DIAGNOSIS — M1712 Unilateral primary osteoarthritis, left knee: Secondary | ICD-10-CM | POA: Diagnosis not present

## 2016-04-27 DIAGNOSIS — M25561 Pain in right knee: Secondary | ICD-10-CM | POA: Diagnosis not present

## 2016-04-27 DIAGNOSIS — M1711 Unilateral primary osteoarthritis, right knee: Secondary | ICD-10-CM | POA: Diagnosis not present

## 2016-04-28 DIAGNOSIS — M1712 Unilateral primary osteoarthritis, left knee: Secondary | ICD-10-CM | POA: Diagnosis not present

## 2016-04-28 DIAGNOSIS — M25562 Pain in left knee: Secondary | ICD-10-CM | POA: Diagnosis not present

## 2016-05-04 DIAGNOSIS — M170 Bilateral primary osteoarthritis of knee: Secondary | ICD-10-CM | POA: Diagnosis not present

## 2016-05-04 DIAGNOSIS — M25562 Pain in left knee: Secondary | ICD-10-CM | POA: Diagnosis not present

## 2016-05-04 DIAGNOSIS — M25561 Pain in right knee: Secondary | ICD-10-CM | POA: Diagnosis not present

## 2016-05-13 DIAGNOSIS — M25562 Pain in left knee: Secondary | ICD-10-CM | POA: Diagnosis not present

## 2016-05-13 DIAGNOSIS — M25561 Pain in right knee: Secondary | ICD-10-CM | POA: Diagnosis not present

## 2016-05-13 DIAGNOSIS — M170 Bilateral primary osteoarthritis of knee: Secondary | ICD-10-CM | POA: Diagnosis not present

## 2016-11-22 DIAGNOSIS — Z23 Encounter for immunization: Secondary | ICD-10-CM | POA: Diagnosis not present

## 2016-11-25 ENCOUNTER — Telehealth: Payer: Self-pay | Admitting: Internal Medicine

## 2016-11-25 NOTE — Telephone Encounter (Signed)
Called pt. No answer, No vm.  Pt due for AWV.  NOTE* Last AWV 01/22/16; please schedule 01/22/17 or after

## 2016-12-01 NOTE — Telephone Encounter (Signed)
Error

## 2017-01-03 NOTE — Telephone Encounter (Signed)
Called pt. Invalid # Pt due for AWV.  NOTE* Last AWV 01/22/16; please schedule 01/22/17 or after

## 2017-06-16 DIAGNOSIS — H43813 Vitreous degeneration, bilateral: Secondary | ICD-10-CM | POA: Diagnosis not present

## 2017-06-16 DIAGNOSIS — H2513 Age-related nuclear cataract, bilateral: Secondary | ICD-10-CM | POA: Diagnosis not present

## 2017-06-16 DIAGNOSIS — H5202 Hypermetropia, left eye: Secondary | ICD-10-CM | POA: Diagnosis not present

## 2017-06-16 DIAGNOSIS — H5211 Myopia, right eye: Secondary | ICD-10-CM | POA: Diagnosis not present

## 2017-06-16 DIAGNOSIS — H52223 Regular astigmatism, bilateral: Secondary | ICD-10-CM | POA: Diagnosis not present

## 2017-07-26 DIAGNOSIS — H2511 Age-related nuclear cataract, right eye: Secondary | ICD-10-CM | POA: Diagnosis not present

## 2017-09-19 DIAGNOSIS — H2511 Age-related nuclear cataract, right eye: Secondary | ICD-10-CM | POA: Diagnosis not present

## 2017-11-01 ENCOUNTER — Telehealth: Payer: Self-pay

## 2017-11-01 NOTE — Telephone Encounter (Signed)
Copied from [place] [id number]. Topic: Medicare AWV >> Nov 01, 2017 12:13 PM Sheran Luz wrote: Reason for CRM: Pt is wanting to schedule his AWV. Please advise.

## 2017-11-22 DIAGNOSIS — Z23 Encounter for immunization: Secondary | ICD-10-CM | POA: Diagnosis not present

## 2017-12-07 ENCOUNTER — Encounter: Payer: Self-pay | Admitting: Internal Medicine

## 2017-12-07 ENCOUNTER — Ambulatory Visit (INDEPENDENT_AMBULATORY_CARE_PROVIDER_SITE_OTHER): Payer: Medicare Other

## 2017-12-07 ENCOUNTER — Ambulatory Visit (INDEPENDENT_AMBULATORY_CARE_PROVIDER_SITE_OTHER): Payer: Medicare Other | Admitting: Internal Medicine

## 2017-12-07 VITALS — BP 126/78 | HR 77 | Temp 98.1°F | Resp 15 | Ht 67.0 in | Wt 164.8 lb

## 2017-12-07 DIAGNOSIS — Z0000 Encounter for general adult medical examination without abnormal findings: Secondary | ICD-10-CM

## 2017-12-07 DIAGNOSIS — Z8719 Personal history of other diseases of the digestive system: Secondary | ICD-10-CM | POA: Diagnosis not present

## 2017-12-07 DIAGNOSIS — Z9889 Other specified postprocedural states: Secondary | ICD-10-CM

## 2017-12-07 DIAGNOSIS — M1711 Unilateral primary osteoarthritis, right knee: Secondary | ICD-10-CM

## 2017-12-07 DIAGNOSIS — K4030 Unilateral inguinal hernia, with obstruction, without gangrene, not specified as recurrent: Secondary | ICD-10-CM | POA: Diagnosis not present

## 2017-12-07 DIAGNOSIS — Z8546 Personal history of malignant neoplasm of prostate: Secondary | ICD-10-CM

## 2017-12-07 DIAGNOSIS — C61 Malignant neoplasm of prostate: Secondary | ICD-10-CM | POA: Diagnosis not present

## 2017-12-07 DIAGNOSIS — E785 Hyperlipidemia, unspecified: Secondary | ICD-10-CM | POA: Diagnosis not present

## 2017-12-07 NOTE — Progress Notes (Signed)
Patient ID: Ethan Singleton, male    DOB: 07/19/1945  Age: 72 y.o. MRN: [id number]  The patient is here for  Follow up and management of other chronic and acute problems.  Last seen Nov 2017   The risk factors are reflected in the social history.  The roster of all physicians providing medical care to patient - is listed in the Snapshot section of the chart.  Activities of daily living:  The patient is 100% independent in all ADLs: dressing, toileting, feeding as well as independent mobility  Home safety : The patient has smoke detectors in the home. They wear seatbelts.  There are no firearms at home. There is no violence in the home.   There is no risks for hepatitis, STDs or HIV. There is no   history of blood transfusion. They have no travel history to infectious disease endemic areas of the world.  The patient has seen their dentist in the last six month. They have seen their eye doctor in the last year. They admit to slight hearing difficulty with regard to whispered voices and some television programs.  They have deferred audiologic testing in the last year.  They do not  have excessive sun exposure. Discussed the need for sun protection: hats, long sleeves and use of sunscreen if there is significant sun exposure.   Diet: the importance of a healthy diet is discussed. They do have a healthy diet.  The benefits of regular aerobic exercise were discussed. She walks 4 times per week ,  20 minutes.   Depression screen: there are no signs or vegative symptoms of depression- irritability, change in appetite, anhedonia, sadness/tearfullness.  Cognitive assessment: the patient manages all their financial and personal affairs and is actively engaged. They could relate day,date,year and events; recalled 2/3 objects at 3 minutes; performed clock-face test normally.  The following portions of the patient's history were reviewed and updated as appropriate: allergies, current medications, past family  history, past medical history,  past surgical history, past social history  and problem list.  Visual acuity was not assessed per patient preference since she has regular follow up with her ophthalmologist. Hearing and body mass index were assessed and reviewed.   During the course of the visit the patient was educated and counseled about appropriate screening and preventive services including : fall prevention , diabetes screening, nutrition counseling, colorectal cancer screening, and recommended immunizations.    CC: The primary encounter diagnosis was History of prostate cancer. Diagnoses of H/O umbilical hernia repair, Hyperlipidemia LDL goal <160, Primary osteoarthritis of right knee, Inguinal hernia with irreducibility, Personal history of prostate cancer, Prostate cancer ([place]), and H/O right inguinal hernia repair were also pertinent to this visit.  1) chronic right knee painL Had stem cell and plasma cell injection  to right knee by  [facility] [employer] with a total of   6 injections (3 bilaterally)which provided  transient relief for nearly 6 months .   Done in July   Pain returned by Winter.   pain currently not keeping him awake,  But can no longer run or play tennis .  Walking 20-30 minutes daily for exercise.  Using ibuprofen prn not more than 2/week.  Pain is aggravated by cold weather .  Has  tricompartmental oa  By priro imaging.   2) Vision:  Cataract right eye removed in June   3) BPH with history of prostate CA:  Some urinary hesitancy  Trouble  with flow unless he drinks a  lot of water   No longer taking fish oil    History Ric has a past medical history of Arthritis (2013), Inguinal hernia with obstruction, without mention of gangrene, recurrent unilateral or unspecified (2014), prostate cancer, and Umbilical hernia with obstruction (2014).   He has a past surgical history that includes meniscotomy; Hernia repair (2006); Hernia repair (2002); Prostate biopsy; and Umbilical  hernia repair (05/19/12).   His family history includes Cancer in his sister; Heart disease in his father; Hypertension in his mother.He reports that he has never smoked. He has never used smokeless tobacco. He reports that he drinks about 2.0 - 3.0 standard drinks of alcohol per week. He reports that he does not use drugs.  Outpatient Medications Prior to Visit  Medication Sig Dispense Refill  . aspirin 81 MG tablet Take 81 mg by mouth daily.    . Cholecalciferol (VITAMIN D-3) 1000 units CAPS Take 1 capsule by mouth daily.    . finasteride (PROSCAR) 5 MG tablet Take 5 mg by mouth daily.    . Omega-3 Fatty Acids (FISH OIL) 1200 MG CAPS Take 1 capsule by mouth daily.    . Turmeric 500 MG CAPS Take 1 capsule by mouth daily.     No facility-administered medications prior to visit.     Review of Systems   Patient denies headache, fevers, malaise, unintentional weight loss, skin rash, eye pain, sinus congestion and sinus pain, sore throat, dysphagia,  hemoptysis , cough, dyspnea, wheezing, chest pain, palpitations, orthopnea, edema, abdominal pain, nausea, melena, diarrhea, constipation, flank pain, dysuria, hematuria, urinary  Frequency, nocturia, numbness, tingling, seizures,  Focal weakness, Loss of consciousness,  Tremor, insomnia, depression, anxiety, and suicidal ideation.      Objective:  BP 126/78 (BP Location: Left Arm, Patient Position: Sitting, Cuff Size: Normal)   Pulse 77   Temp 98.1 F (36.7 C) (Oral)   Resp 15   Ht 5\' 7"  (1.702 m)   Wt 164 lb 12.8 oz (74.8 kg)   SpO2 97%   BMI 25.81 kg/m   Physical Exam   General appearance: alert, cooperative and appears stated age Ears: normal TM's and external ear canals both ears Throat: lips, mucosa, and tongue normal; teeth and gums normal Neck: no adenopathy, no carotid bruit, supple, symmetrical, trachea midline and thyroid not enlarged, symmetric, no tenderness/mass/nodules Back: symmetric, no curvature. ROM normal. No CVA  tenderness. Lungs: clear to auscultation bilaterally Heart: regular rate and rhythm, S1, S2 normal, no murmur, click, rub or gallop Abdomen: soft, non-tender; bowel sounds normal; no masses,  no organomegaly Pulses: 2+ and symmetric Skin: Skin color, texture, turgor normal. No rashes or lesions Lymph nodes: Cervical, supraclavicular, and axillary nodes normal.    Assessment & Plan:   Problem List Items Addressed This Visit    DJD (degenerative joint disease) of knee    Avoiding surgery .  S/p stem cell transplant which provided transient relief ([facility]).  Using NSAIDs,  Dosing advised and recommended adding tylenol up to 2000 mg daily       H/O right inguinal hernia repair    Repair was done by Jamal Collin in [id number]        H/O umbilical hernia repair   RESOLVED: Inguinal hernia with irreducibility    S/p RIH repair in 2014 by sankar       Prostate cancer [facility])    He has not had psa in a year and has not seen Urology Bernardo Heater) since 2016.  PSA has jumped significantly  He will be referred to Dr Bernardo Heater for follow up.   Lab Results  Component Value Date   PSA 13.03 (H) 12/07/2017   PSA 2.30 01/22/2016  \      Relevant Orders   Ambulatory referral to Urology    Other Visit Diagnoses    History of prostate cancer    -  Primary   Relevant Orders   PSA, Medicare (Completed)   Hyperlipidemia LDL goal <160       Relevant Orders   Comprehensive metabolic panel (Completed)   Lipid panel (Completed)    A total of 40 minutes was spent with patient more than half of which was spent in counseling patient on the above mentioned issues , reviewing and explaining recent labs and imaging studies done, and coordination of care.   I have discontinued Fischer D. Pullara's finasteride, Fish Oil, and Turmeric. I am also having him maintain his aspirin and Vitamin D-3.  No orders of the defined types were placed in this encounter.   Medications Discontinued During This Encounter   Medication Reason  . finasteride (PROSCAR) 5 MG tablet Patient has not taken in last 30 days  . Omega-3 Fatty Acids (FISH OIL) 1200 MG CAPS Patient Preference  . Turmeric 500 MG CAPS     Follow-up: No follow-ups on file.   Crecencio Mc, MD

## 2017-12-07 NOTE — Progress Notes (Addendum)
Subjective:   Ethan Singleton is a 72 y.o. male who presents for Medicare Annual/Subsequent preventive examination.  Review of Systems:  No ROS.  Medicare Wellness Visit. Additional risk factors are reflected in the social history.  Cardiac Risk Factors include: advanced age (>64men, >58 women);male gender     Objective:    Vitals: BP 126/78 (BP Location: Left Arm, Patient Position: Sitting, Cuff Size: Normal)   Pulse 77   Temp 98.1 F (36.7 C) (Oral)   Resp 15   Ht 5\' 7"  (1.702 m)   Wt 164 lb 12.8 oz (74.8 kg)   SpO2 97%   BMI 25.81 kg/m   Body mass index is 25.81 kg/m.  Advanced Directives 12/07/2017  Does Patient Have a Medical Advance Directive? Yes  Type of Paramedic of [place];Living will  Does patient want to make changes to medical advance directive? No - Patient declined  Copy of [place] in Chart? No - copy requested    Tobacco Social History   Tobacco Use  Smoking Status Never Smoker  Smokeless Tobacco Never Used     Counseling given: Not Answered   Clinical Intake:  Pre-visit preparation completed: Yes  Pain : No/denies pain     Nutritional Status: BMI 25 -29 Overweight Diabetes: No  How often do you need to have someone help you when you read instructions, pamphlets, or other written materials from your doctor or pharmacy?: 1 - Never  Interpreter Needed?: No     Past Medical History:  Diagnosis Date  . Arthritis 2013  . Inguinal hernia with obstruction, without mention of gangrene, recurrent unilateral or unspecified 2014  . prostate cancer   . Umbilical hernia with obstruction 2014   Past Surgical History:  Procedure Laterality Date  . HERNIA REPAIR  2006   done in [place], right   . HERNIA REPAIR  2002   [facility], [place], with mesh, laparoscopic  . meniscotomy     partial, right knee  . PROSTATE BIOPSY     procedure done for elevated psa. 1 sample positive for cancer out of 16 taken. by  Dr. Bernardo Heater  . UMBILICAL HERNIA REPAIR  05/19/12   [facility]H repair and umbilical hernia repair by Dr. Joie Bimler) with mesh if indicated   Family History  Problem Relation Age of Onset  . Hypertension Mother   . Heart disease Father   . Cancer Sister        lung Ca,  passive smoke exposure    Social History   Socioeconomic History  . Marital status: Married    Spouse name: Not on file  . Number of children: Not on file  . Years of education: Not on file  . Highest education level: Not on file  Occupational History  . Not on file  Social Needs  . Financial resource strain: Not hard at all  . Food insecurity:    Worry: Never true    Inability: Never true  . Transportation needs:    Medical: No    Non-medical: No  Tobacco Use  . Smoking status: Never Smoker  . Smokeless tobacco: Never Used  Substance and Sexual Activity  . Alcohol use: Yes    Alcohol/week: 2.0 - 3.0 standard drinks    Types: 2 - 3 Glasses of wine per week    Comment: drinks wine a couple of times/week with dinner                                                                       .  Drug use: No  . Sexual activity: Not on file  Lifestyle  . Physical activity:    Days per week: 0 days    Minutes per session: Not on file  . Stress: Not at all  Relationships  . Social connections:    Talks on phone: Not on file    Gets together: Not on file    Attends religious service: Not on file    Active member of club or organization: Not on file    Attends meetings of clubs or organizations: Not on file    Relationship status: Not on file  Other Topics Concern  . Not on file  Social History Narrative  . Not on file    Outpatient Encounter Medications as of 12/07/2017  Medication Sig  . aspirin 81 MG tablet Take 81 mg by mouth daily.  . Cholecalciferol (VITAMIN D-3) 1000 units CAPS Take 1 capsule by mouth daily.  . finasteride (PROSCAR) 5 MG tablet Take 5 mg by mouth daily.  . Omega-3 Fatty Acids (FISH  OIL) 1200 MG CAPS Take 1 capsule by mouth daily.  . Turmeric 500 MG CAPS Take 1 capsule by mouth daily.   No facility-administered encounter medications on file as of 12/07/2017.     Activities of Daily Living In your present state of health, do you have any difficulty performing the following activities: 12/07/2017  Hearing? N  Vision? N  Difficulty concentrating or making decisions? N  Walking or climbing stairs? N  Dressing or bathing? N  Doing errands, shopping? N  Preparing Food and eating ? N  Using the Toilet? N  In the past six months, have you accidently leaked urine? N  Do you have problems with loss of bowel control? N  Managing your Medications? N  Managing your Finances? N  Housekeeping or managing your Housekeeping? N  Some recent data might be hidden    Patient Care Team: Crecencio Mc, MD as PCP - General (Internal Medicine)   Assessment:   This is a routine wellness examination for [place].  The goal of the wellness visit is to assist the patient how to close the gaps in care and create a preventative care plan for the patient.   Taking calcium VIT D as appropriate/Osteoporosis risk reviewed.    Safety issues reviewed; Smoke and carbon monoxide detectors in the home. No firearms in the home. Wears seatbelts when driving or riding with others. No violence in the home.  They do not have excessive sun exposure.  Discussed the need for sun protection: hats, long sleeves and the use of sunscreen if there is significant sun exposure.  Patient is alert, normal appearance, oriented to person/place/and time.  Correctly identified the president of the [place] and recalls of 3/3 words. Performs simple calculations and can read correct time from watch face.  Displays appropriate judgement.  No new identified risk were noted.  No failures at ADL's or IADL's.  BMI- discussed the importance of a healthy diet, water intake and the benefits of aerobic exercise.  24 hour diet  recall: Low cholesterol diet. Little to no red meat  Dental- every 12 months.  The [employer].  Eye- Visual acuity not assessed per patient preference since they have regular follow up with the ophthalmologist.  Wears corrective lenses.  Sleep patterns- Sleeps well through the night.   Health maintenance gaps- closed.  Keep all routine maintenance appointments.   Exercise Activities and Dietary recommendations Current Exercise Habits: Home exercise routine, Intensity:  Moderate  Goals    . Increase physical activity     Walk more exercise       Fall Risk Fall Risk  12/07/2017 12/23/2014  Falls in the past year? Yes No  Number falls in past yr: 1 -  Injury with Fall? Yes -  Comment Sore R shoulder.  He did not seek medical attention.  No falls since this one 8 months ago.  -  Follow up Falls prevention discussed -   Depression Screen PHQ 2/9 Scores 12/07/2017 12/23/2014  PHQ - 2 Score 0 0    Cognitive Function     6CIT Screen 12/07/2017  What Year? 0 points  What month? 0 points  What time? 0 points  Count back from 20 0 points  Months in reverse 0 points  Repeat phrase 0 points  Total Score 0    Immunization History  Administered Date(s) Administered  . Influenza-Unspecified 11/23/2015, 11/20/2017  . Pneumococcal Conjugate-13 11/23/2015  . Pneumococcal Polysaccharide-23 02/25/2012, 12/10/2014  . Tdap 12/12/2014  . Zoster 10/19/2006   Screening Tests Health Maintenance  Topic Date Due  . COLONOSCOPY  12/23/2022  . TETANUS/TDAP  12/11/2024  . INFLUENZA VACCINE  Completed  . Hepatitis C Screening  Completed  . PNA vac Low Risk Adult  Completed       Plan:   End of life planning; Advance aging; Advanced directives discussed. Copy of current HCPOA/Living Will requested.    I have personally reviewed and noted the following in the patient's chart:   . Medical and social history . Use of alcohol, tobacco or illicit drugs  . Current medications and  supplements . Functional ability and status . Nutritional status . Physical activity . Advanced directives . List of other physicians . Hospitalizations, surgeries, and ER visits in previous 12 months . Vitals . Screenings to include cognitive, depression, and falls . Referrals and appointments  In addition, I have reviewed and discussed with patient certain preventive protocols, quality metrics, and best practice recommendations. A written personalized care plan for preventive services as well as general preventive health recommendations were provided to patient.     OBrien-Blaney, Margaruite Top L, LPN  27/0/7867    I have reviewed the above information and agree with above.   Deborra Medina, MD

## 2017-12-07 NOTE — Patient Instructions (Addendum)
Good to see you!    Regarding your knee pain:  I would limit ibuprofen dose to 600 mg single dose, if used  Just once daily ,  Or 400 mg  If needed Up to 3 times daily    MAXIMUM DOSE   Remember that You can add up to 2000 mg of acetominophen (tylenol) every day safely  In divided doses (500 mg every 6 hours  Or 1000 mg every 12 hours.)  650 mg c 3 doses = 2000 500 mg x 4 dose 1000 mg x 2 doses    Health Maintenance, Male A healthy lifestyle and preventive care is important for your health and wellness. Ask your health care provider about what schedule of regular examinations is right for you. What should I know about weight and diet? Eat a Healthy Diet  Eat plenty of vegetables, fruits, whole grains, low-fat dairy products, and lean protein.  Do not eat a lot of foods high in solid fats, added sugars, or salt.  Maintain a Healthy Weight Regular exercise can help you achieve or maintain a healthy weight. You should:  Do at least 150 minutes of exercise each week. The exercise should increase your heart rate and make you sweat (moderate-intensity exercise).  Do strength-training exercises at least twice a week.  Watch Your Levels of Cholesterol and Blood Lipids  Have your blood tested for lipids and cholesterol every 5 years starting at 72 years of age. If you are at high risk for heart disease, you should start having your blood tested when you are 72 years old. You may need to have your cholesterol levels checked more often if: ? Your lipid or cholesterol levels are high. ? You are older than 72 years of age. ? You are at high risk for heart disease.  What should I know about cancer screening? Many types of cancers can be detected early and may often be prevented. Lung Cancer  You should be screened every year for lung cancer if: ? You are a current smoker who has smoked for at least 30 years. ? You are a former smoker who has quit within the past 15 years.  Talk to your  health care provider about your screening options, when you should start screening, and how often you should be screened.  Colorectal Cancer  Routine colorectal cancer screening usually begins at 72 years of age and should be repeated every 5-10 years until you are 72 years old. You may need to be screened more often if early forms of precancerous polyps or small growths are found. Your health care provider may recommend screening at an earlier age if you have risk factors for colon cancer.  Your health care provider may recommend using home test kits to check for hidden blood in the stool.  A small camera at the end of a tube can be used to examine your colon (sigmoidoscopy or colonoscopy). This checks for the earliest forms of colorectal cancer.  Prostate and Testicular Cancer  Depending on your age and overall health, your health care provider may do certain tests to screen for prostate and testicular cancer.  Talk to your health care provider about any symptoms or concerns you have about testicular or prostate cancer.  Skin Cancer  Check your skin from head to toe regularly.  Tell your health care provider about any new moles or changes in moles, especially if: ? There is a change in a mole's size, shape, or color. ? You have  a mole that is larger than a pencil eraser.  Always use sunscreen. Apply sunscreen liberally and repeat throughout the day.  Protect yourself by wearing long sleeves, pants, a wide-brimmed hat, and sunglasses when outside.  What should I know about heart disease, diabetes, and high blood pressure?  If you are 26-25 years of age, have your blood pressure checked every 3-5 years. If you are 6 years of age or older, have your blood pressure checked every year. You should have your blood pressure measured twice-once when you are at a hospital or clinic, and once when you are not at a hospital or clinic. Record the average of the two measurements. To check your  blood pressure when you are not at a hospital or clinic, you can use: ? An automated blood pressure machine at a pharmacy. ? A home blood pressure monitor.  Talk to your health care provider about your target blood pressure.  If you are between 10-58 years old, ask your health care provider if you should take aspirin to prevent heart disease.  Have regular diabetes screenings by checking your fasting blood sugar level. ? If you are at a normal weight and have a low risk for diabetes, have this test once every three years after the age of 38. ? If you are overweight and have a high risk for diabetes, consider being tested at a younger age or more often.  A one-time screening for abdominal aortic aneurysm (AAA) by ultrasound is recommended for men aged 64-75 years who are current or former smokers. What should I know about preventing infection? Hepatitis B If you have a higher risk for hepatitis B, you should be screened for this virus. Talk with your health care provider to find out if you are at risk for hepatitis B infection. Hepatitis C Blood testing is recommended for:  Everyone born from [birth year] through 1965.  Anyone with known risk factors for hepatitis C.  Sexually Transmitted Diseases (STDs)  You should be screened each year for STDs including gonorrhea and chlamydia if: ? You are sexually active and are younger than 72 years of age. ? You are older than 72 years of age and your health care provider tells you that you are at risk for this type of infection. ? Your sexual activity has changed since you were last screened and you are at an increased risk for chlamydia or gonorrhea. Ask your health care provider if you are at risk.  Talk with your health care provider about whether you are at high risk of being infected with HIV. Your health care provider may recommend a prescription medicine to help prevent HIV infection.  What else can I do?  Schedule regular health, dental, and  eye exams.  Stay current with your vaccines (immunizations).  Do not use any tobacco products, such as cigarettes, chewing tobacco, and e-cigarettes. If you need help quitting, ask your health care provider.  Limit alcohol intake to no more than 2 drinks per day. One drink equals 12 ounces of beer, 5 ounces of wine, or 1 ounces of hard liquor.  Do not use street drugs.  Do not share needles.  Ask your health care provider for help if you need support or information about quitting drugs.  Tell your health care provider if you often feel depressed.  Tell your health care provider if you have ever been abused or do not feel safe at home. This information is not intended to replace advice given to you  by your health care provider. Make sure you discuss any questions you have with your health care provider. Document Released: 08/21/2007 Document Revised: 10/22/2015 Document Reviewed: 11/26/2014 Elsevier Interactive Patient Education  [employer].

## 2017-12-07 NOTE — Patient Instructions (Addendum)
  [name] , Thank you for taking time to come for your Medicare Wellness Visit. I appreciate your ongoing commitment to your health goals. Please review the following plan we discussed and let me know if I can assist you in the future.   Follow up as needed.    Bring a copy of your [place] and/or Living Will to be scanned into chart.  Have a great day!  These are the goals we discussed: Goals    . Increase physical activity     Walk more exercise       This is a list of the screening recommended for you and due dates:  Health Maintenance  Topic Date Due  . Colon Cancer Screening  12/23/2022  . Tetanus Vaccine  12/11/2024  . Flu Shot  Completed  .  Hepatitis C: One time screening is recommended by Center for Disease Control  (CDC) for  adults born from [birth year] through 1965.   Completed  . Pneumonia vaccines  Completed

## 2017-12-08 LAB — COMPREHENSIVE METABOLIC PANEL
ALK PHOS: 47 U/L (ref 39–117)
ALT: 13 U/L (ref 0–53)
AST: 19 U/L (ref 0–37)
Albumin: 4.3 g/dL (ref 3.5–5.2)
BILIRUBIN TOTAL: 1.2 mg/dL (ref 0.2–1.2)
BUN: 14 mg/dL (ref 6–23)
CO2: 27 meq/L (ref 19–32)
Calcium: 9.5 mg/dL (ref 8.4–10.5)
Chloride: 105 mEq/L (ref 96–112)
Creatinine, Ser: 0.87 mg/dL (ref 0.40–1.50)
GFR: 91.71 mL/min (ref >60.00)
Glucose, Bld: 76 mg/dL (ref 70–99)
Potassium: 4 mEq/L (ref 3.5–5.1)
Sodium: 140 mEq/L (ref 135–145)
TOTAL PROTEIN: 7 g/dL (ref 6.0–8.3)

## 2017-12-08 LAB — LIPID PANEL
CHOLESTEROL: 205 mg/dL — AB (ref 0–200)
HDL: 33.4 mg/dL — ABNORMAL LOW (ref >39.00)
NonHDL: 171.17
TRIGLYCERIDES: 386 mg/dL — AB (ref 0.0–149.0)
Total CHOL/HDL Ratio: 6
VLDL: 77.2 mg/dL — ABNORMAL HIGH (ref 0.0–40.0)

## 2017-12-08 LAB — PSA, MEDICARE: PSA: 13.03 ng/mL — AB (ref 0.10–4.00)

## 2017-12-08 LAB — LDL CHOLESTEROL, DIRECT: Direct LDL: 102 mg/dL

## 2017-12-10 DIAGNOSIS — Z8719 Personal history of other diseases of the digestive system: Secondary | ICD-10-CM | POA: Insufficient documentation

## 2017-12-10 DIAGNOSIS — Z9889 Other specified postprocedural states: Secondary | ICD-10-CM

## 2017-12-10 NOTE — Assessment & Plan Note (Signed)
Avoiding surgery .  S/p stem cell transplant which provided transient relief ([employer]).  Using NSAIDs,  Dosing advised and recommended adding tylenol up to 2000 mg daily

## 2017-12-10 NOTE — Assessment & Plan Note (Signed)
He has not had psa in a year and has not seen [facility]) since 2016.  PSA has jumped significantly  He will be referred to Dr Bernardo Heater for follow up.   Lab Results  Component Value Date   PSA 13.03 (H) 12/07/2017   PSA 2.30 01/22/2016  \

## 2017-12-10 NOTE — Assessment & Plan Note (Signed)
S/p RIH repair in 2014 by sankar

## 2017-12-10 NOTE — Assessment & Plan Note (Signed)
Repair was done by [facility] in 2014

## 2017-12-12 DIAGNOSIS — C61 Malignant neoplasm of prostate: Secondary | ICD-10-CM

## 2017-12-12 NOTE — Telephone Encounter (Signed)
Patient has history of low risk prostate cancer  Followed with no intervention but has now moved to [place] and has been lost to follow up.  His PSA is  Elevated.   I have placed the referral to DR Bryson Corona, he will need the notes from DR [facility] that are in Epic.    Thanks  Helene Kelp

## 2017-12-19 NOTE — Telephone Encounter (Signed)
Vaccines have been documented in chart.

## 2018-01-02 NOTE — Telephone Encounter (Signed)
Yes, I just responded to him. I had faxed his referral to a number that they gave me. When I just called them back they gave me another fax numberso I faxed it to that number. Thx, melissa

## 2018-02-08 DIAGNOSIS — R972 Elevated prostate specific antigen [PSA]: Secondary | ICD-10-CM | POA: Diagnosis not present

## 2018-02-08 DIAGNOSIS — C61 Malignant neoplasm of prostate: Secondary | ICD-10-CM | POA: Diagnosis not present

## 2018-02-08 DIAGNOSIS — Z6825 Body mass index (BMI) 25.0-25.9, adult: Secondary | ICD-10-CM | POA: Diagnosis not present

## 2018-12-05 ENCOUNTER — Telehealth: Payer: Self-pay | Admitting: Internal Medicine

## 2018-12-05 NOTE — Telephone Encounter (Signed)
-----   Message from Salvatore Marvel sent at 12/04/2018 12:57 PM EDT ----- Regarding: remove pcp Pt has moved to [place]

## 2018-12-05 NOTE — Telephone Encounter (Signed)
Remove PCP per Ebony Hail.

## 2018-12-11 ENCOUNTER — Ambulatory Visit: Payer: Medicare Other | Admitting: Internal Medicine

## 2018-12-11 ENCOUNTER — Ambulatory Visit: Payer: Medicare Other
